# Patient Record
Sex: Male | Born: 1965 | Race: White | Hispanic: No
Health system: Midwestern US, Community
[De-identification: ages and names within clinical notes are randomized; demographics above are authoritative.]

## PROBLEM LIST (undated history)

## (undated) DIAGNOSIS — I10 Essential (primary) hypertension: Secondary | ICD-10-CM

## (undated) DIAGNOSIS — E7849 Other hyperlipidemia: Secondary | ICD-10-CM

## (undated) DIAGNOSIS — I2699 Other pulmonary embolism without acute cor pulmonale: Secondary | ICD-10-CM

## (undated) DIAGNOSIS — I639 Cerebral infarction, unspecified: Secondary | ICD-10-CM

## (undated) DIAGNOSIS — I509 Heart failure, unspecified: Secondary | ICD-10-CM

## (undated) DIAGNOSIS — IMO0001 Reserved for inherently not codable concepts without codable children: Secondary | ICD-10-CM

## (undated) DIAGNOSIS — I219 Acute myocardial infarction, unspecified: Secondary | ICD-10-CM

## (undated) DIAGNOSIS — Z720 Tobacco use: Secondary | ICD-10-CM

## (undated) DIAGNOSIS — I2510 Atherosclerotic heart disease of native coronary artery without angina pectoris: Secondary | ICD-10-CM

## (undated) HISTORY — PX: FINGER FRACTURE SURGERY: SHX638

## (undated) HISTORY — DX: Reserved for inherently not codable concepts without codable children: IMO0001

## (undated) HISTORY — PX: BACK SURGERY: SHX140

## (undated) HISTORY — PX: OTHER SURGICAL HISTORY: SHX169

## (undated) HISTORY — DX: Essential (primary) hypertension: I10

## (undated) HISTORY — DX: Acute myocardial infarction, unspecified: I21.9

## (undated) HISTORY — DX: Atherosclerotic heart disease of native coronary artery without angina pectoris: I25.10

## (undated) HISTORY — PX: CHOLECYSTECTOMY: SHX55

## (undated) HISTORY — DX: Other pulmonary embolism without acute cor pulmonale: I26.99

---

## 2010-03-19 DIAGNOSIS — I2699 Other pulmonary embolism without acute cor pulmonale: Secondary | ICD-10-CM

## 2010-03-19 HISTORY — DX: Other pulmonary embolism without acute cor pulmonale: I26.99

## 2012-07-18 HISTORY — PX: CARDIAC CATHETERIZATION: SHX172

## 2012-11-12 DIAGNOSIS — I2510 Atherosclerotic heart disease of native coronary artery without angina pectoris: Secondary | ICD-10-CM

## 2014-08-23 ENCOUNTER — Encounter: Payer: Self-pay | Admitting: Cardiovascular Disease

## 2014-08-23 ENCOUNTER — Ambulatory Visit (INDEPENDENT_AMBULATORY_CARE_PROVIDER_SITE_OTHER): Payer: BLUE CROSS/BLUE SHIELD | Admitting: Cardiovascular Disease

## 2014-08-23 VITALS — BP 132/86 | HR 64 | Ht 69.0 in | Wt 207.1 lb

## 2014-08-23 DIAGNOSIS — I739 Peripheral vascular disease, unspecified: Secondary | ICD-10-CM

## 2014-08-23 DIAGNOSIS — I2510 Atherosclerotic heart disease of native coronary artery without angina pectoris: Secondary | ICD-10-CM | POA: Insufficient documentation

## 2014-08-23 DIAGNOSIS — I10 Essential (primary) hypertension: Secondary | ICD-10-CM | POA: Insufficient documentation

## 2014-08-23 DIAGNOSIS — Z720 Tobacco use: Secondary | ICD-10-CM

## 2014-08-23 DIAGNOSIS — E785 Hyperlipidemia, unspecified: Secondary | ICD-10-CM | POA: Insufficient documentation

## 2014-08-23 DIAGNOSIS — R079 Chest pain, unspecified: Secondary | ICD-10-CM

## 2014-08-23 DIAGNOSIS — R0602 Shortness of breath: Secondary | ICD-10-CM

## 2014-08-23 MED ORDER — NITROGLYCERIN 0.4 MG SL SUBL: 0.4000 mg | SUBLINGUAL_TABLET | SUBLINGUAL | Status: DC | PRN
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
## 2014-08-23 NOTE — Patient Instructions (Addendum)
Labs - do not eat or drink the morning of the  Lab draw   Your physician has requested that you have en exercise stress myoview. For further information please visit [email or web address] Please follow instruction sheet, as given.    Schedule for ABI'S--Your physician has requested that you have a lower extremity arterial duplex. This test is an ultrasound of the arteries in the legs. It looks at arterial blood flow in the legs . Allow one hour for Lower scans. There are no restrictions or special instructions  PRESCRIPTION SENT TO PHARMACY -NTG  Your physician wants you to follow-up in 6 MONTHS DR [facility]  You will receive a reminder letter in the mail two months in advance. If you don't receive a letter, please call our office to schedule the follow-up appointment.

## 2014-09-11 ENCOUNTER — Telehealth (HOSPITAL_COMMUNITY): Payer: Self-pay

## 2014-09-11 NOTE — Telephone Encounter (Signed)
Encounter complete. 

## 2014-09-13 ENCOUNTER — Inpatient Hospital Stay (HOSPITAL_COMMUNITY): Admission: RE | Admit: 2014-09-13 | Payer: BLUE CROSS/BLUE SHIELD | Source: Ambulatory Visit

## 2014-09-20 ENCOUNTER — Ambulatory Visit (HOSPITAL_COMMUNITY)
Admission: RE | Admit: 2014-09-20 | Discharge: 2014-09-20 | Disposition: A | Discharge status: Home / Self Care | Payer: BLUE CROSS/BLUE SHIELD | Source: Ambulatory Visit | Attending: Cardiovascular Disease | Admitting: Cardiovascular Disease

## 2014-09-20 DIAGNOSIS — I2510 Atherosclerotic heart disease of native coronary artery without angina pectoris: Secondary | ICD-10-CM | POA: Insufficient documentation

## 2014-09-20 DIAGNOSIS — R079 Chest pain, unspecified: Secondary | ICD-10-CM | POA: Diagnosis not present

## 2014-09-20 DIAGNOSIS — F17200 Nicotine dependence, unspecified, uncomplicated: Secondary | ICD-10-CM | POA: Insufficient documentation

## 2014-09-20 DIAGNOSIS — E785 Hyperlipidemia, unspecified: Secondary | ICD-10-CM | POA: Insufficient documentation

## 2014-09-20 DIAGNOSIS — I10 Essential (primary) hypertension: Secondary | ICD-10-CM | POA: Insufficient documentation

## 2014-09-20 DIAGNOSIS — I739 Peripheral vascular disease, unspecified: Secondary | ICD-10-CM | POA: Insufficient documentation

## 2014-09-26 ENCOUNTER — Telehealth: Payer: Self-pay | Admitting: *Deleted

## 2014-09-26 NOTE — Telephone Encounter (Signed)
-----   Message from Chilton Si, MD sent at 09/23/2014  4:18 PM EDT ----- Normal ABIs.  The arterial blood flow in the legs is normal.

## 2014-09-26 NOTE — Telephone Encounter (Signed)
Spoke to patient. Result given . Verbalized understanding  

## 2014-10-02 ENCOUNTER — Telehealth (HOSPITAL_COMMUNITY): Payer: Self-pay

## 2014-10-02 NOTE — Telephone Encounter (Signed)
Encounter complete. 

## 2014-10-03 ENCOUNTER — Telehealth (HOSPITAL_COMMUNITY): Payer: Self-pay

## 2014-10-03 NOTE — Telephone Encounter (Signed)
Encounter complete. 

## 2014-10-04 ENCOUNTER — Ambulatory Visit (HOSPITAL_COMMUNITY)
Admission: RE | Admit: 2014-10-04 | Discharge: 2014-10-04 | Disposition: A | Discharge status: Home / Self Care | Payer: BLUE CROSS/BLUE SHIELD | Source: Ambulatory Visit | Attending: Cardiovascular Disease | Admitting: Cardiovascular Disease

## 2014-10-04 DIAGNOSIS — R079 Chest pain, unspecified: Secondary | ICD-10-CM | POA: Diagnosis not present

## 2014-10-04 DIAGNOSIS — I2510 Atherosclerotic heart disease of native coronary artery without angina pectoris: Secondary | ICD-10-CM | POA: Diagnosis not present

## 2014-10-04 DIAGNOSIS — E785 Hyperlipidemia, unspecified: Secondary | ICD-10-CM | POA: Diagnosis not present

## 2014-10-04 DIAGNOSIS — I739 Peripheral vascular disease, unspecified: Secondary | ICD-10-CM

## 2014-10-04 DIAGNOSIS — R9439 Abnormal result of other cardiovascular function study: Secondary | ICD-10-CM | POA: Insufficient documentation

## 2014-10-04 DIAGNOSIS — R42 Dizziness and giddiness: Secondary | ICD-10-CM | POA: Diagnosis not present

## 2014-10-04 DIAGNOSIS — F17200 Nicotine dependence, unspecified, uncomplicated: Secondary | ICD-10-CM | POA: Diagnosis not present

## 2014-10-04 DIAGNOSIS — I517 Cardiomegaly: Secondary | ICD-10-CM | POA: Diagnosis not present

## 2014-10-04 DIAGNOSIS — I10 Essential (primary) hypertension: Secondary | ICD-10-CM | POA: Diagnosis not present

## 2014-10-04 DIAGNOSIS — R0602 Shortness of breath: Secondary | ICD-10-CM | POA: Diagnosis not present

## 2014-10-04 DIAGNOSIS — R0609 Other forms of dyspnea: Secondary | ICD-10-CM | POA: Diagnosis not present

## 2014-10-04 MED ORDER — TECHNETIUM TC 99M SESTAMIBI GENERIC - CARDIOLITE
10.9000 | Freq: Once | INTRAVENOUS | Status: AC | PRN
  Administered 2014-10-04: 10.9 via INTRAVENOUS

## 2014-10-04 MED ORDER — TECHNETIUM TC 99M SESTAMIBI GENERIC - CARDIOLITE
31.3000 | Freq: Once | INTRAVENOUS | Status: AC | PRN
  Administered 2014-10-04: 31.3 via INTRAVENOUS

## 2014-10-05 LAB — MYOCARDIAL PERFUSION IMAGING
CHL CUP MPHR: 170 {beats}/min
CHL CUP NUCLEAR SRS: 13
CHL CUP RESTING HR STRESS: 71 {beats}/min
Estimated workload: 11.7 METS
Exercise duration (min): 10 min
LV sys vol: 87 mL
LVDIAVOL: 143 mL
Peak HR: 157 {beats}/min
Percent HR: 92 %
RPE: 16
SDS: 2
SSS: 15
TID: 0.98

## 2014-10-09 ENCOUNTER — Telehealth: Payer: Self-pay | Admitting: *Deleted

## 2014-10-09 NOTE — Telephone Encounter (Signed)
-----   Message from Chilton Si, MD sent at 10/07/2014 10:44 PM EDT ----- Stress test was abnormal.  Please schedule an appointment to discuss this stress test.

## 2014-10-09 NOTE — Telephone Encounter (Signed)
Spoke to patient. Result given . Verbalized understanding A PATIENT ONLY ABLE TO COME ON A Friday APPT -  SCHEDULE FOR 11/08/14 11AM

## 2014-10-11 NOTE — Telephone Encounter (Signed)
Informed patient as patient request, okay to  wait for appt  Per Dr Duke Salvia

## 2014-10-14 ENCOUNTER — Other Ambulatory Visit: Payer: Self-pay | Admitting: Cardiovascular Disease

## 2014-10-14 MED ORDER — ATORVASTATIN CALCIUM 80 MG PO TABS: 80.0000 mg | ORAL_TABLET | Freq: Every day | ORAL | Status: DC

## 2014-10-14 MED ORDER — CARVEDILOL 3.125 MG PO TABS: 3.1250 mg | ORAL_TABLET | Freq: Two times a day (BID) | ORAL | Status: DC

## 2014-10-14 MED ORDER — LISINOPRIL 5 MG PO TABS: 5.0000 mg | ORAL_TABLET | Freq: Every day | ORAL | Status: DC

## 2014-10-14 NOTE — Telephone Encounter (Signed)
°  1. Which medications need to be refilled? Lisinopril,Atorvastatin and Carvedilol-please call today  2. Which pharmacy is medication to be sent to?[phone]  3. Do they need a 30 day or 90 day supply? 90 and refills  4. Would they like a call back once the medication has been sent to the pharmacy? Yes-please leave message if no answer

## 2014-10-14 NOTE — Telephone Encounter (Signed)
Medications refilled electronically - carvedilol,atorvastatin, and lisinopril.  Patient notified.

## 2014-10-14 NOTE — Telephone Encounter (Signed)
°  1. Which medications need to be refilled? Carvedilol, Atorvastatin, Lisinopril  2. Which pharmacy is medication to be sent to?[facility] on [address]   3. Do they need a 30 day or 90 day supply? He would like a 90  4. Would they like a call back once the medication has been sent to the pharmacy? Yes

## 2014-10-14 NOTE — Telephone Encounter (Signed)
Medications refilled electronically - carvedilol,atorvastatin, and lisinopril.  Patient notified. 
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
## 2014-11-08 ENCOUNTER — Ambulatory Visit (INDEPENDENT_AMBULATORY_CARE_PROVIDER_SITE_OTHER): Payer: BLUE CROSS/BLUE SHIELD | Admitting: Cardiovascular Disease

## 2014-11-08 ENCOUNTER — Encounter: Payer: Self-pay | Admitting: Cardiology

## 2014-11-08 VITALS — BP 102/72 | HR 71 | Ht 69.0 in | Wt 212.9 lb

## 2014-11-08 DIAGNOSIS — I208 Other forms of angina pectoris: Secondary | ICD-10-CM

## 2014-11-08 DIAGNOSIS — I209 Angina pectoris, unspecified: Secondary | ICD-10-CM | POA: Diagnosis not present

## 2014-11-08 DIAGNOSIS — Z01810 Encounter for preprocedural cardiovascular examination: Secondary | ICD-10-CM

## 2014-11-08 DIAGNOSIS — R5383 Other fatigue: Secondary | ICD-10-CM

## 2014-11-08 DIAGNOSIS — R9439 Abnormal result of other cardiovascular function study: Secondary | ICD-10-CM

## 2014-11-08 DIAGNOSIS — D689 Coagulation defect, unspecified: Secondary | ICD-10-CM | POA: Diagnosis not present

## 2014-11-08 DIAGNOSIS — R5381 Other malaise: Secondary | ICD-10-CM

## 2014-11-08 DIAGNOSIS — I2089 Other forms of angina pectoris: Secondary | ICD-10-CM

## 2014-11-08 NOTE — Patient Instructions (Signed)
Dr [place] has requested that you have a cardiac catheterization. Cardiac catheterization is used to diagnose and/or treat various heart conditions. Doctors may recommend this procedure for a number of different reasons. The most common reason is to evaluate chest pain. Chest pain can be a symptom of coronary artery disease (CAD), and cardiac catheterization can show whether plaque is narrowing or blocking your heart's arteries. This procedure is also used to evaluate the valves, as well as measure the blood flow and oxygen levels in different parts of your heart. For further information please visit [email or web address]. Please follow instruction sheet, as given.  You will be required to have the following tests prior to the procedure:  1. Blood work-the blood work can be done no more than 7 days prior to the procedure.  It can be done at any [facility] lab.  There is one downstairs on the first floor of this building and one in the [facility] building ([address]).  2. Chest Xray-the chest xray order has already been placed at [facility] in the [facility].    Dr [place] recommends that you schedule a follow-up appointment after your procedure. 

## 2014-11-09 ENCOUNTER — Encounter: Payer: Self-pay | Admitting: Cardiovascular Disease

## 2014-11-12 ENCOUNTER — Telehealth: Payer: Self-pay | Admitting: Cardiovascular Disease

## 2014-11-12 NOTE — Telephone Encounter (Signed)
Faxed signed release to [facility] --Dr Cephas DarbyPeter Dilmartino - to obtain records per Dr Leonides Sakeandolph's request.  Faxed to [phone] on 11/12/14. lp

## 2014-11-13 ENCOUNTER — Telehealth: Payer: Self-pay | Admitting: Cardiovascular Disease

## 2014-11-13 DIAGNOSIS — I209 Angina pectoris, unspecified: Secondary | ICD-10-CM | POA: Diagnosis present

## 2014-11-13 DIAGNOSIS — R9439 Abnormal result of other cardiovascular function study: Secondary | ICD-10-CM | POA: Diagnosis present

## 2014-11-13 NOTE — Telephone Encounter (Signed)
Received records from [facility] per our request for records.  Records given to Dr Duke Salviaandolph for review. lp

## 2014-11-20 ENCOUNTER — Other Ambulatory Visit: Payer: Self-pay

## 2014-11-20 ENCOUNTER — Other Ambulatory Visit: Payer: Self-pay | Admitting: *Deleted

## 2014-11-20 DIAGNOSIS — R9439 Abnormal result of other cardiovascular function study: Secondary | ICD-10-CM

## 2014-11-20 DIAGNOSIS — I209 Angina pectoris, unspecified: Secondary | ICD-10-CM

## 2014-11-20 NOTE — Progress Notes (Signed)
Cath orders placed for procedure 11/22/2014 with Dr Bryan Lemmaavid Harding.

## 2014-11-21 ENCOUNTER — Ambulatory Visit
Admission: RE | Admit: 2014-11-21 | Discharge: 2014-11-21 | Disposition: A | Discharge status: Home / Self Care | Payer: BLUE CROSS/BLUE SHIELD | Source: Ambulatory Visit | Attending: Cardiovascular Disease | Admitting: Cardiovascular Disease

## 2014-11-21 DIAGNOSIS — I209 Angina pectoris, unspecified: Secondary | ICD-10-CM

## 2014-11-21 DIAGNOSIS — Z01810 Encounter for preprocedural cardiovascular examination: Secondary | ICD-10-CM

## 2014-11-21 DIAGNOSIS — R9439 Abnormal result of other cardiovascular function study: Secondary | ICD-10-CM

## 2014-11-21 LAB — CBC
HCT: 49 % (ref 39.0–52.0)
Hemoglobin: 16.4 g/dL (ref 13.0–17.0)
MCH: 29.9 pg (ref 26.0–34.0)
MCHC: 33.5 g/dL (ref 30.0–36.0)
MCV: 89.4 fL (ref 78.0–100.0)
MPV: 10.2 fL (ref 8.6–12.4)
PLATELETS: 211 10*3/uL (ref 150–400)
RBC: 5.48 MIL/uL (ref 4.22–5.81)
RDW: 12.5 % (ref 11.5–15.5)
WBC: 8.7 10*3/uL (ref 4.0–10.5)

## 2014-11-22 LAB — TSH: TSH: 0.45 u[IU]/mL (ref 0.350–4.500)

## 2014-11-22 LAB — BASIC METABOLIC PANEL
BUN: 14 mg/dL (ref 7–25)
CALCIUM: 8.9 mg/dL (ref 8.6–10.3)
CO2: 28 mmol/L (ref 20–31)
Chloride: 105 mmol/L (ref 98–110)
Creat: 0.85 mg/dL (ref 0.70–1.33)
Glucose, Bld: 81 mg/dL (ref 65–99)
POTASSIUM: 4.5 mmol/L (ref 3.5–5.3)
SODIUM: 140 mmol/L (ref 135–146)

## 2014-11-22 LAB — PROTIME-INR
INR: 0.94 (ref <1.50)
Prothrombin Time: 12.7 seconds (ref 11.6–15.2)

## 2014-11-22 LAB — APTT: APTT: 29 s (ref 24–37)

## 2014-11-25 ENCOUNTER — Telehealth: Payer: Self-pay | Admitting: *Deleted

## 2014-11-25 NOTE — Telephone Encounter (Signed)
Left message all test are good for procedure

## 2014-11-28 ENCOUNTER — Ambulatory Visit (HOSPITAL_COMMUNITY)
Admission: RE | Admit: 2014-11-28 | Discharge: 2014-11-28 | Disposition: A | Discharge status: Home / Self Care | Payer: BLUE CROSS/BLUE SHIELD | Source: Ambulatory Visit | Attending: Cardiology | Admitting: Cardiology

## 2014-11-28 ENCOUNTER — Encounter (HOSPITAL_COMMUNITY)
Admission: RE | Disposition: A | Discharge status: Home / Self Care | Payer: Self-pay | Source: Ambulatory Visit | Attending: Cardiology

## 2014-11-28 DIAGNOSIS — I10 Essential (primary) hypertension: Secondary | ICD-10-CM | POA: Diagnosis not present

## 2014-11-28 DIAGNOSIS — E785 Hyperlipidemia, unspecified: Secondary | ICD-10-CM | POA: Diagnosis present

## 2014-11-28 DIAGNOSIS — Z8249 Family history of ischemic heart disease and other diseases of the circulatory system: Secondary | ICD-10-CM | POA: Diagnosis not present

## 2014-11-28 DIAGNOSIS — R9439 Abnormal result of other cardiovascular function study: Secondary | ICD-10-CM | POA: Diagnosis present

## 2014-11-28 DIAGNOSIS — Z7982 Long term (current) use of aspirin: Secondary | ICD-10-CM | POA: Diagnosis not present

## 2014-11-28 DIAGNOSIS — I252 Old myocardial infarction: Secondary | ICD-10-CM | POA: Insufficient documentation

## 2014-11-28 DIAGNOSIS — R0602 Shortness of breath: Secondary | ICD-10-CM | POA: Diagnosis present

## 2014-11-28 DIAGNOSIS — Z6830 Body mass index (BMI) 30.0-30.9, adult: Secondary | ICD-10-CM | POA: Insufficient documentation

## 2014-11-28 DIAGNOSIS — Z9861 Coronary angioplasty status: Secondary | ICD-10-CM

## 2014-11-28 DIAGNOSIS — Z86711 Personal history of pulmonary embolism: Secondary | ICD-10-CM | POA: Diagnosis not present

## 2014-11-28 DIAGNOSIS — E669 Obesity, unspecified: Secondary | ICD-10-CM | POA: Diagnosis not present

## 2014-11-28 DIAGNOSIS — I2510 Atherosclerotic heart disease of native coronary artery without angina pectoris: Secondary | ICD-10-CM | POA: Diagnosis not present

## 2014-11-28 DIAGNOSIS — Z955 Presence of coronary angioplasty implant and graft: Secondary | ICD-10-CM | POA: Insufficient documentation

## 2014-11-28 DIAGNOSIS — R079 Chest pain, unspecified: Secondary | ICD-10-CM | POA: Diagnosis not present

## 2014-11-28 DIAGNOSIS — F17210 Nicotine dependence, cigarettes, uncomplicated: Secondary | ICD-10-CM | POA: Diagnosis not present

## 2014-11-28 DIAGNOSIS — I209 Angina pectoris, unspecified: Secondary | ICD-10-CM | POA: Diagnosis present

## 2014-11-28 DIAGNOSIS — Z720 Tobacco use: Secondary | ICD-10-CM | POA: Diagnosis present

## 2014-11-28 HISTORY — PX: CARDIAC CATHETERIZATION: SHX172

## 2014-11-28 SURGERY — LEFT HEART CATH AND CORONARY ANGIOGRAPHY
Anesthesia: LOCAL

## 2014-11-28 MED ORDER — MIDAZOLAM HCL 2 MG/2ML IJ SOLN
INTRAMUSCULAR | Status: DC | PRN
  Administered 2014-11-28: 2 mg via INTRAVENOUS
  Administered 2014-11-28: 1 mg via INTRAVENOUS

## 2014-11-28 MED ORDER — MIDAZOLAM HCL 2 MG/2ML IJ SOLN
INTRAMUSCULAR | Status: AC
  Filled 2014-11-28: qty 4

## 2014-11-28 MED ORDER — SODIUM CHLORIDE 0.9 % IJ SOLN: 3.0000 mL | Freq: Two times a day (BID) | INTRAMUSCULAR | Status: DC

## 2014-11-28 MED ORDER — VERAPAMIL HCL 2.5 MG/ML IV SOLN
INTRAVENOUS | Status: AC
  Filled 2014-11-28: qty 2

## 2014-11-28 MED ORDER — LIDOCAINE HCL (PF) 1 % IJ SOLN
INTRAMUSCULAR | Status: AC
  Filled 2014-11-28: qty 30

## 2014-11-28 MED ORDER — SODIUM CHLORIDE 0.9 % IJ SOLN: 3.0000 mL | INTRAMUSCULAR | Status: DC | PRN

## 2014-11-28 MED ORDER — FENTANYL CITRATE (PF) 100 MCG/2ML IJ SOLN
INTRAMUSCULAR | Status: DC | PRN
  Administered 2014-11-28: 50 ug via INTRAVENOUS
  Administered 2014-11-28: 25 ug via INTRAVENOUS

## 2014-11-28 MED ORDER — SODIUM CHLORIDE 0.9 % WEIGHT BASED INFUSION: 1.0000 mL/kg/h | INTRAVENOUS | Status: AC

## 2014-11-28 MED ORDER — HEPARIN SODIUM (PORCINE) 1000 UNIT/ML IJ SOLN
INTRAMUSCULAR | Status: DC | PRN
Start: 2014-11-28 — End: 2014-11-28
  Administered 2014-11-28: 5000 [IU] via INTRAVENOUS

## 2014-11-28 MED ORDER — ASPIRIN 81 MG PO CHEW: 81.0000 mg | CHEWABLE_TABLET | ORAL | Status: DC

## 2014-11-28 MED ORDER — HEPARIN (PORCINE) IN NACL 2-0.9 UNIT/ML-% IJ SOLN
INTRAMUSCULAR | Status: AC
  Filled 2014-11-28: qty 1000

## 2014-11-28 MED ORDER — SODIUM CHLORIDE 0.9 % WEIGHT BASED INFUSION
3.0000 mL/kg/h | INTRAVENOUS | Status: DC
  Administered 2014-11-28: 3 mL/kg/h via INTRAVENOUS

## 2014-11-28 MED ORDER — FENTANYL CITRATE (PF) 100 MCG/2ML IJ SOLN
INTRAMUSCULAR | Status: AC
  Filled 2014-11-28: qty 4

## 2014-11-28 MED ORDER — LIDOCAINE HCL (PF) 1 % IJ SOLN
INTRAMUSCULAR | Status: DC | PRN
  Administered 2014-11-28: 11:00:00

## 2014-11-28 MED ORDER — SODIUM CHLORIDE 0.9 % WEIGHT BASED INFUSION: 1.0000 mL/kg/h | INTRAVENOUS | Status: DC

## 2014-11-28 MED ORDER — SODIUM CHLORIDE 0.9 % IV SOLN: 250.0000 mL | INTRAVENOUS | Status: DC | PRN

## 2014-11-28 MED ORDER — VERAPAMIL HCL 2.5 MG/ML IV SOLN
INTRAVENOUS | Status: DC | PRN
  Administered 2014-11-28: 2 mL via INTRA_ARTERIAL
  Administered 2014-11-28: 11:00:00 via INTRA_ARTERIAL

## 2014-11-28 MED ORDER — HEPARIN SODIUM (PORCINE) 1000 UNIT/ML IJ SOLN
INTRAMUSCULAR | Status: AC
  Filled 2014-11-28: qty 1

## 2014-11-28 MED ORDER — IOHEXOL 350 MG/ML SOLN
INTRAVENOUS | Status: DC | PRN
  Administered 2014-11-28: 75 mL via INTRA_ARTERIAL

## 2014-11-28 SURGICAL SUPPLY — 11 items

## 2014-11-28 NOTE — Discharge Instructions (Signed)
Radial Site Care °Refer to this sheet in the next few weeks. These instructions provide you with information about caring for yourself after your procedure. Your health care provider may also give you more specific instructions. Your treatment has been planned according to current medical practices, but problems sometimes occur. Call your health care provider if you have any problems or questions after your procedure. °WHAT TO EXPECT AFTER THE PROCEDURE °After your procedure, it is typical to have the following: °· Bruising at the radial site that usually fades within 1-2 weeks. °· Blood collecting in the tissue (hematoma) that may be painful to the touch. It should usually decrease in size and tenderness within 1-2 weeks. °HOME CARE INSTRUCTIONS °· Take medicines only as directed by your health care provider. °· You may shower 24-48 hours after the procedure or as directed by your health care provider. Remove the bandage (dressing) and gently wash the site with plain soap and water. Pat the area dry with a clean towel. Do not rub the site, because this may cause bleeding. °· Do not take baths, swim, or use a hot tub until your health care provider approves. °· Check your insertion site every day for redness, swelling, or drainage. °· Do not apply powder or lotion to the site. °· Do not flex or bend the affected arm for 24 hours or as directed by your health care provider. °· Do not push or pull heavy objects with the affected arm for 24 hours or as directed by your health care provider. °· Do not lift over 10 lb (4.5 kg) for 5 days after your procedure or as directed by your health care provider. °· Ask your health care provider when it is okay to: °¨ Return to work or school. °¨ Resume usual physical activities or sports. °¨ Resume sexual activity. °· Do not drive home if you are discharged the same day as the procedure. Have someone else drive you. °· You may drive 24 hours after the procedure unless otherwise  instructed by your health care provider. °· Do not operate machinery or power tools for 24 hours after the procedure. °· If your procedure was done as an outpatient procedure, which means that you went home the same day as your procedure, a responsible adult should be with you for the first 24 hours after you arrive home. °· Keep all follow-up visits as directed by your health care provider. This is important. °SEEK MEDICAL CARE IF: °· You have a fever. °· You have chills. °· You have increased bleeding from the radial site. Hold pressure on the site. °SEEK IMMEDIATE MEDICAL CARE IF: °· You have unusual pain at the radial site. °· You have redness, warmth, or swelling at the radial site. °· You have drainage (other than a small amount of blood on the dressing) from the radial site. °· The radial site is bleeding, and the bleeding does not stop after 30 minutes of holding steady pressure on the site. °· Your arm or hand becomes pale, cool, tingly, or numb. °  °This information is not intended to replace advice given to you by your health care provider. Make sure you discuss any questions you have with your health care provider. °  °Document Released: 02/06/2010 Document Revised: 01/25/2014 Document Reviewed: 07/23/2013 °Elsevier Interactive Patient Education ©2016 [employer] ° °

## 2014-11-28 NOTE — Interval H&P Note (Signed)
Cath Lab Visit (complete for each Cath Lab visit)  Clinical Evaluation Leading to the Procedure:   ACS: No.  Non-ACS:    Anginal Classification: CCS III  Anti-ischemic medical therapy: Minimal Therapy (1 class of medications)  Non-Invasive Test Results: High-risk stress test findings: cardiac mortality >3%/year  Prior CABG: No previous CABG      History and Physical Interval Note:  11/28/2014 10:28 AM  Carl Sanchez  has presented today for surgery, with the diagnosis of Abnormal Stress Test  The various methods of treatment have been discussed with the patient and family. After consideration of risks, benefits and other options for treatment, the patient has consented to  Procedure(s): Left Heart Cath and Coronary Angiography (N/A) as a surgical intervention .  The patient's history has been reviewed, patient examined, no change in status, stable for surgery.  I have reviewed the patient's chart and labs.  Questions were answered to the patient's satisfaction.     Evin Chirco S.

## 2014-11-28 NOTE — Progress Notes (Signed)
Carl Sanchez from cath lab came and checked client's right wrist and per Carl Sanchez site does not look swollen and call Dr Eldridge DaceVaranasi and notified him of above and right radial pulse 1+ and no new orders noted and ok to d/c home
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
## 2014-11-29 ENCOUNTER — Encounter (HOSPITAL_COMMUNITY): Payer: Self-pay | Admitting: Interventional Cardiology

## 2014-12-02 ENCOUNTER — Telehealth: Payer: Self-pay | Admitting: *Deleted

## 2014-12-02 MED ORDER — RANOLAZINE ER 500 MG PO TB12: 500.0000 mg | ORAL_TABLET | Freq: Two times a day (BID) | ORAL | Status: DC

## 2014-12-02 NOTE — Telephone Encounter (Signed)
Spoke to patient. Information given to start Ranexa 500 mg  Twice a day Patient states he would like to research the medications first -- but will pick samples that are available. (before prescription is given) Appointment schedule for Dec 19, 2014 at 11 am Patient verbalized understanding.

## 2014-12-02 NOTE — Telephone Encounter (Signed)
-----   Message from Chilton Siiffany , MD sent at 12/02/2014  6:03 AM EST ----- Please start [name] on Ranexa 500 mg bid and schedule for follow up in 2 weeks.

## 2014-12-09 ENCOUNTER — Other Ambulatory Visit: Payer: Self-pay | Admitting: Cardiovascular Disease

## 2014-12-09 NOTE — Telephone Encounter (Signed)
°*  STAT* If patient is at the pharmacy, call can be transferred to refill team.   1. Which medications need to be refilled? (please list name of each medication and dose if known) Ranexa   2. Which pharmacy/location (including street and city if local pharmacy) is medication to be sent to?[facility]  3. Do they need a 30 day or 90 day supply? 90 and refills

## 2014-12-16 ENCOUNTER — Telehealth: Payer: Self-pay | Admitting: Cardiovascular Disease

## 2014-12-16 MED ORDER — RANOLAZINE ER 500 MG PO TB12: 500.0000 mg | ORAL_TABLET | Freq: Two times a day (BID) | ORAL | Status: DC

## 2014-12-16 MED ORDER — NITROGLYCERIN 0.4 MG SL SUBL: 0.4000 mg | SUBLINGUAL_TABLET | SUBLINGUAL | Status: DC | PRN

## 2014-12-16 NOTE — Telephone Encounter (Signed)
Refills completed, advised to get new Nitro bottle, pt aware.

## 2014-12-16 NOTE — Telephone Encounter (Signed)
Returned call to patient Ranexa 500 mg samples left at [facility] office front desk. 

## 2014-12-16 NOTE — Telephone Encounter (Signed)
Carl Sanchez is calling because the Ranexa is really expensive even with insurance , its $200 for Three months and is wanting some samples .Marland Kitchen. Please call she wants to pick it up today ..Marland Kitchen

## 2014-12-16 NOTE — Telephone Encounter (Signed)
°*  STAT* If patient is at the pharmacy, call can be transferred to refill team.   1. Which medications need to be refilled? (please list name of each medication and dose if known) Ranexa   2. Which pharmacy/location (including street and city if local pharmacy) is medication to be sent to?[facility] [address]   3. Do they need a 30 day or 90 day supply? 30   *Pt's girlfriend also called in stating that the pt's Nitro pills were left in the car and the temperatures were around freezing , she wanted to know if the pills were still good and if not can a new prescription be called in for him*
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
## 2014-12-19 ENCOUNTER — Ambulatory Visit (INDEPENDENT_AMBULATORY_CARE_PROVIDER_SITE_OTHER): Payer: BLUE CROSS/BLUE SHIELD | Admitting: Cardiovascular Disease

## 2014-12-19 ENCOUNTER — Encounter: Payer: Self-pay | Admitting: Cardiovascular Disease

## 2014-12-19 VITALS — BP 109/73 | HR 73 | Ht 69.0 in | Wt 208.5 lb

## 2014-12-19 DIAGNOSIS — I10 Essential (primary) hypertension: Secondary | ICD-10-CM

## 2014-12-19 DIAGNOSIS — Z716 Tobacco abuse counseling: Secondary | ICD-10-CM | POA: Diagnosis not present

## 2014-12-19 DIAGNOSIS — I2583 Coronary atherosclerosis due to lipid rich plaque: Principal | ICD-10-CM

## 2014-12-19 DIAGNOSIS — I2510 Atherosclerotic heart disease of native coronary artery without angina pectoris: Secondary | ICD-10-CM | POA: Diagnosis not present

## 2014-12-19 DIAGNOSIS — E785 Hyperlipidemia, unspecified: Secondary | ICD-10-CM | POA: Diagnosis not present

## 2014-12-19 MED ORDER — CARVEDILOL 3.125 MG PO TABS: 3.1250 mg | ORAL_TABLET | Freq: Two times a day (BID) | ORAL | Status: DC

## 2014-12-19 MED ORDER — ATORVASTATIN CALCIUM 80 MG PO TABS: 80.0000 mg | ORAL_TABLET | Freq: Every day | ORAL | Status: DC

## 2014-12-19 MED ORDER — LISINOPRIL 5 MG PO TABS: 5.0000 mg | ORAL_TABLET | Freq: Every day | ORAL | Status: DC

## 2014-12-19 MED ORDER — BUPROPION HCL ER (SR) 150 MG PO TB12: ORAL_TABLET | ORAL | Status: DC

## 2014-12-19 MED ORDER — RANOLAZINE ER 500 MG PO TB12: 500.0000 mg | ORAL_TABLET | Freq: Two times a day (BID) | ORAL | Status: DC

## 2014-12-19 NOTE — Patient Instructions (Signed)
Medication Instructions:  START WELLBUTRIN 150 MG ONCE DAILY FOR 3 DAYS AND THEN TWICE A DAY FOR 12 WEEKS  Labwork: NONE  Testing/Procedures: NONE  Follow-Up: Your physician wants you to follow-up in: 6 MONTH OV You will receive a reminder letter in the mail two months in advance. If you don't receive a letter, please call our office to schedule the follow-up appointment.  If you need a refill on your cardiac medications before your next appointment, please call your pharmacy.

## 2014-12-24 ENCOUNTER — Telehealth: Payer: Self-pay | Admitting: Cardiovascular Disease

## 2014-12-24 NOTE — Telephone Encounter (Signed)
Pt still having dizziness on Ranexa. [name] also cites high cost of med as prohibitive. Wants to know if OK for pt to discontinue.  I inquired about recent BPs - none to report, she states "well how am I going to check it? I don't have a machine at home." Offered BP visit, Rx for cuff - this was unacknowledged. Caller dismissive as I tried to discuss options, inquire about other symptoms - somewhat confrontational. I explained I was just trying to brainstorm possible options and would defer to physician for advice.  She states pt will be home tomorrow. Asked if we can we make recommendation on the medication. Informed I would defer to [facility] for advice.

## 2014-12-24 NOTE — Telephone Encounter (Signed)
Sounds like a good plan.  Will defer to Dr. Duke Salviaandolph.  Marykay LexHARDING, Ron Junco W, MD

## 2014-12-24 NOTE — Telephone Encounter (Signed)
[name] states that Ranexa is still making [name] dizzy.  Can he stop taking it?

## 2014-12-24 NOTE — Telephone Encounter (Signed)
He can stop, but be aware that chest discomfort may come back.  He could be switched to isosorbide mono 30 mg and adjusted accordingly

## 2014-12-26 NOTE — Telephone Encounter (Signed)
Left message for return call to discuss recommendations from [place].

## 2015-01-14 ENCOUNTER — Telehealth: Payer: Self-pay | Admitting: Cardiovascular Disease

## 2015-01-14 MED ORDER — ISOSORBIDE MONONITRATE ER 30 MG PO TB24: 30.0000 mg | ORAL_TABLET | Freq: Every day | ORAL | Status: DC

## 2015-01-14 NOTE — Telephone Encounter (Signed)
Renexa discontinued as to price and c/o dizziness Isosorbide mon 30 mg [phone] refills

## 2015-01-14 NOTE — Telephone Encounter (Signed)
Have him try isosorbide mono 30 mg qd.  Would suggest starting with 30 day supply to be sure he tolerates it and we can determine if the dose is best.

## 2015-01-14 NOTE — Telephone Encounter (Signed)
Please call,concerning his Ranexa.

## 2015-01-14 NOTE — Telephone Encounter (Signed)
Patient called this morning concerning his Ranexa It is causing dizziness Can not afford the $100  Reviewing past notes Dr. Antoine PocheHochrein has approved of switching him from Renexa to Isosorbide mono 30 mg Patient and finance [name] agree  Routing to [place] for dose and frequency Also routing to Dr. Duke Salviaandolph  When instructions received for dosage and frequency of Isosorbide mono I will discontinue Renexa and [facility] [address] for a 90 day supply and  Call patient back [phone])

## 2015-01-17 NOTE — Telephone Encounter (Signed)
OK thank you 

## 2015-01-27 ENCOUNTER — Telehealth: Payer: Self-pay | Admitting: Cardiovascular Disease

## 2015-01-27 NOTE — Telephone Encounter (Signed)
Carl Sanchez's prior records from the [facility] were reviewed.  Echo 11/02/14: LVEF 40%.  Normal diastolic function.  Mild left ventricular hypertrophy.Normal RV function.   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  He underwent PCI of the LAD with a DES in October, 2015.  There is no mention of cardiac arrest in his clinic notes, though a discharge summary is not available.

## 2015-02-20 ENCOUNTER — Other Ambulatory Visit: Payer: Self-pay | Admitting: *Deleted

## 2015-02-20 MED ORDER — LISINOPRIL 5 MG PO TABS: 5.0000 mg | ORAL_TABLET | Freq: Every day | ORAL | Status: DC

## 2015-02-20 MED ORDER — ISOSORBIDE MONONITRATE ER 30 MG PO TB24: 30.0000 mg | ORAL_TABLET | Freq: Every day | ORAL | Status: DC

## 2015-02-20 MED ORDER — CARVEDILOL 3.125 MG PO TABS: 3.1250 mg | ORAL_TABLET | Freq: Two times a day (BID) | ORAL | Status: DC

## 2015-02-20 MED ORDER — ATORVASTATIN CALCIUM 80 MG PO TABS: 80.0000 mg | ORAL_TABLET | Freq: Every day | ORAL | Status: DC

## 2015-02-26 ENCOUNTER — Other Ambulatory Visit: Payer: Self-pay | Admitting: *Deleted

## 2015-02-26 MED ORDER — BUPROPION HCL ER (SR) 150 MG PO TB12: ORAL_TABLET | ORAL | Status: DC

## 2015-04-11 ENCOUNTER — Emergency Department (HOSPITAL_COMMUNITY): Payer: BLUE CROSS/BLUE SHIELD

## 2015-04-11 ENCOUNTER — Inpatient Hospital Stay (HOSPITAL_COMMUNITY)
Admission: EM | Admit: 2015-04-11 | Discharge: 2015-04-14 | DRG: 066 | Disposition: A | Discharge status: Home Health | Payer: BLUE CROSS/BLUE SHIELD | Attending: Family Medicine | Admitting: Family Medicine

## 2015-04-11 ENCOUNTER — Encounter (HOSPITAL_COMMUNITY): Payer: Self-pay | Admitting: Neurology

## 2015-04-11 DIAGNOSIS — I639 Cerebral infarction, unspecified: Principal | ICD-10-CM | POA: Diagnosis present

## 2015-04-11 DIAGNOSIS — Z720 Tobacco use: Secondary | ICD-10-CM | POA: Diagnosis present

## 2015-04-11 DIAGNOSIS — G43909 Migraine, unspecified, not intractable, without status migrainosus: Secondary | ICD-10-CM | POA: Diagnosis not present

## 2015-04-11 DIAGNOSIS — R200 Anesthesia of skin: Secondary | ICD-10-CM | POA: Diagnosis present

## 2015-04-11 DIAGNOSIS — Z955 Presence of coronary angioplasty implant and graft: Secondary | ICD-10-CM

## 2015-04-11 DIAGNOSIS — I10 Essential (primary) hypertension: Secondary | ICD-10-CM | POA: Diagnosis not present

## 2015-04-11 DIAGNOSIS — Z6828 Body mass index (BMI) 28.0-28.9, adult: Secondary | ICD-10-CM

## 2015-04-11 DIAGNOSIS — F17210 Nicotine dependence, cigarettes, uncomplicated: Secondary | ICD-10-CM | POA: Diagnosis present

## 2015-04-11 DIAGNOSIS — R402362 Coma scale, best motor response, obeys commands, at arrival to emergency department: Secondary | ICD-10-CM | POA: Diagnosis present

## 2015-04-11 DIAGNOSIS — R519 Headache, unspecified: Secondary | ICD-10-CM | POA: Diagnosis present

## 2015-04-11 DIAGNOSIS — R42 Dizziness and giddiness: Secondary | ICD-10-CM | POA: Diagnosis not present

## 2015-04-11 DIAGNOSIS — R51 Headache: Secondary | ICD-10-CM

## 2015-04-11 DIAGNOSIS — E669 Obesity, unspecified: Secondary | ICD-10-CM | POA: Diagnosis present

## 2015-04-11 DIAGNOSIS — R29702 NIHSS score 2: Secondary | ICD-10-CM | POA: Diagnosis present

## 2015-04-11 DIAGNOSIS — Z8249 Family history of ischemic heart disease and other diseases of the circulatory system: Secondary | ICD-10-CM

## 2015-04-11 DIAGNOSIS — Z87828 Personal history of other (healed) physical injury and trauma: Secondary | ICD-10-CM

## 2015-04-11 DIAGNOSIS — H5500 Unspecified nystagmus: Secondary | ICD-10-CM | POA: Diagnosis present

## 2015-04-11 DIAGNOSIS — I6789 Other cerebrovascular disease: Secondary | ICD-10-CM | POA: Diagnosis present

## 2015-04-11 DIAGNOSIS — I252 Old myocardial infarction: Secondary | ICD-10-CM

## 2015-04-11 DIAGNOSIS — R402252 Coma scale, best verbal response, oriented, at arrival to emergency department: Secondary | ICD-10-CM | POA: Diagnosis present

## 2015-04-11 DIAGNOSIS — E785 Hyperlipidemia, unspecified: Secondary | ICD-10-CM | POA: Diagnosis present

## 2015-04-11 DIAGNOSIS — Z9861 Coronary angioplasty status: Secondary | ICD-10-CM

## 2015-04-11 DIAGNOSIS — R402142 Coma scale, eyes open, spontaneous, at arrival to emergency department: Secondary | ICD-10-CM | POA: Diagnosis present

## 2015-04-11 DIAGNOSIS — E1165 Type 2 diabetes mellitus with hyperglycemia: Secondary | ICD-10-CM | POA: Diagnosis present

## 2015-04-11 DIAGNOSIS — Z7982 Long term (current) use of aspirin: Secondary | ICD-10-CM

## 2015-04-11 DIAGNOSIS — G43109 Migraine with aura, not intractable, without status migrainosus: Secondary | ICD-10-CM

## 2015-04-11 DIAGNOSIS — Z86711 Personal history of pulmonary embolism: Secondary | ICD-10-CM

## 2015-04-11 DIAGNOSIS — I2510 Atherosclerotic heart disease of native coronary artery without angina pectoris: Secondary | ICD-10-CM

## 2015-04-11 LAB — DIFFERENTIAL
BASOS PCT: 0 %
Basophils Absolute: 0.1 10*3/uL (ref 0.0–0.1)
EOS PCT: 2 %
Eosinophils Absolute: 0.2 10*3/uL (ref 0.0–0.7)
Lymphocytes Relative: 39 %
Lymphs Abs: 4.5 10*3/uL — ABNORMAL HIGH (ref 0.7–4.0)
MONO ABS: 1 10*3/uL (ref 0.1–1.0)
Monocytes Relative: 8 %
Neutro Abs: 5.8 10*3/uL (ref 1.7–7.7)
Neutrophils Relative %: 51 %

## 2015-04-11 LAB — CBC
HCT: 50.5 % (ref 39.0–52.0)
Hemoglobin: 17.5 g/dL — ABNORMAL HIGH (ref 13.0–17.0)
MCH: 29.7 pg (ref 26.0–34.0)
MCHC: 34.7 g/dL (ref 30.0–36.0)
MCV: 85.6 fL (ref 78.0–100.0)
PLATELETS: 223 10*3/uL (ref 150–400)
RBC: 5.9 MIL/uL — ABNORMAL HIGH (ref 4.22–5.81)
RDW: 12 % (ref 11.5–15.5)
WBC: 11.5 10*3/uL — ABNORMAL HIGH (ref 4.0–10.5)

## 2015-04-11 LAB — COMPREHENSIVE METABOLIC PANEL
ALBUMIN: 4 g/dL (ref 3.5–5.0)
ALT: 36 U/L (ref 17–63)
ANION GAP: 13 (ref 5–15)
AST: 31 U/L (ref 15–41)
Alkaline Phosphatase: 93 U/L (ref 38–126)
BILIRUBIN TOTAL: 1 mg/dL (ref 0.3–1.2)
BUN: 9 mg/dL (ref 6–20)
CHLORIDE: 107 mmol/L (ref 101–111)
CO2: 20 mmol/L — ABNORMAL LOW (ref 22–32)
Calcium: 9.4 mg/dL (ref 8.9–10.3)
Creatinine, Ser: 1.22 mg/dL (ref 0.61–1.24)
GFR calc Af Amer: >60 mL/min (ref >60)
GFR calc non Af Amer: >60 mL/min (ref >60)
GLUCOSE: 143 mg/dL — AB (ref 65–99)
POTASSIUM: 3.5 mmol/L (ref 3.5–5.1)
Sodium: 140 mmol/L (ref 135–145)
TOTAL PROTEIN: 7.2 g/dL (ref 6.5–8.1)

## 2015-04-11 LAB — I-STAT CHEM 8, ED
BUN: 10 mg/dL (ref 6–20)
CALCIUM ION: 1.06 mmol/L — AB (ref 1.12–1.23)
CHLORIDE: 105 mmol/L (ref 101–111)
Creatinine, Ser: 1 mg/dL (ref 0.61–1.24)
Glucose, Bld: 139 mg/dL — ABNORMAL HIGH (ref 65–99)
HEMATOCRIT: 55 % — AB (ref 39.0–52.0)
Hemoglobin: 18.7 g/dL — ABNORMAL HIGH (ref 13.0–17.0)
Potassium: 3.4 mmol/L — ABNORMAL LOW (ref 3.5–5.1)
SODIUM: 143 mmol/L (ref 135–145)
TCO2: 22 mmol/L (ref 0–100)

## 2015-04-11 LAB — CBG MONITORING, ED: GLUCOSE-CAPILLARY: 125 mg/dL — AB (ref 65–99)

## 2015-04-11 LAB — PROTIME-INR
INR: 1 (ref 0.00–1.49)
PROTHROMBIN TIME: 13.4 s (ref 11.6–15.2)

## 2015-04-11 LAB — I-STAT TROPONIN, ED: Troponin i, poc: 0 ng/mL (ref 0.00–0.08)

## 2015-04-11 LAB — APTT: aPTT: 24 seconds (ref 24–37)

## 2015-04-11 MED ORDER — DIPHENHYDRAMINE HCL 50 MG/ML IJ SOLN
12.5000 mg | Freq: Once | INTRAMUSCULAR | Status: AC
  Administered 2015-04-12: 12.5 mg via INTRAVENOUS
  Filled 2015-04-11: qty 1

## 2015-04-11 MED ORDER — PROCHLORPERAZINE EDISYLATE 5 MG/ML IJ SOLN
10.0000 mg | Freq: Once | INTRAMUSCULAR | Status: AC
  Administered 2015-04-12: 10 mg via INTRAVENOUS
  Filled 2015-04-11: qty 2

## 2015-04-11 MED ORDER — KETOROLAC TROMETHAMINE 15 MG/ML IJ SOLN
15.0000 mg | Freq: Once | INTRAMUSCULAR | Status: AC
  Administered 2015-04-12: 15 mg via INTRAVENOUS
  Filled 2015-04-11: qty 1

## 2015-04-11 MED ORDER — ONDANSETRON HCL 4 MG/2ML IJ SOLN
4.0000 mg | Freq: Once | INTRAMUSCULAR | Status: AC
  Administered 2015-04-11: 4 mg via INTRAVENOUS
  Filled 2015-04-11: qty 2

## 2015-04-11 MED ORDER — IOHEXOL 350 MG/ML SOLN
50.0000 mL | Freq: Once | INTRAVENOUS | Status: AC | PRN
  Administered 2015-04-11: 50 mL via INTRAVENOUS
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
## 2015-04-11 NOTE — ED Notes (Signed)
Family at bedside. 

## 2015-04-11 NOTE — ED Notes (Signed)
Patient transported back to CT at this time for CTA. Neurologist at bedside.

## 2015-04-11 NOTE — Code Documentation (Addendum)
[name] for acute onset Rt side HA, dizziness, and near syncope while getting ready for work.  Symptoms began at 2100.  [facility] he was profusely diaphoretic, pale, and actively vomiting.  Per EMS SBP 130s, HR 60s, sats 97% RA, BS 102.  NIH 2 for drowsiness and Lt facial numbness, + nystagmus. Pt taken to CT for head without and then CTA head and neck.  Zofran given in CT scan for nausea/vomiting. HX MI with stent & HTN per pt.  States he is taking an ASA but is unsure of his other meds.

## 2015-04-11 NOTE — ED Notes (Signed)
Patient passed stroke swallow screen. Provided him with ice chips upon request.

## 2015-04-11 NOTE — ED Notes (Signed)
Dr Beaton at bedside 

## 2015-04-11 NOTE — ED Notes (Signed)
PER [facility]: Patient to ED from home with stroke symptoms starting at 2100 this evening. Patient got out of shower and immediately started experiencing dizziness, N/V x 2, diaphoresis, and R sided headache. Nystagmus present upon arrival, no facial droop or aphasia noted. Pt hx MI, HTN. EMS VS: HR 60 NSR, 138/80, 97% RA, CBG 102, 18g. L hand. . Patient appears lethargic and very weak at this time.
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
## 2015-04-12 ENCOUNTER — Observation Stay (HOSPITAL_COMMUNITY): Payer: BLUE CROSS/BLUE SHIELD

## 2015-04-12 ENCOUNTER — Encounter (HOSPITAL_COMMUNITY): Payer: Self-pay | Admitting: Internal Medicine

## 2015-04-12 DIAGNOSIS — F17210 Nicotine dependence, cigarettes, uncomplicated: Secondary | ICD-10-CM | POA: Diagnosis present

## 2015-04-12 DIAGNOSIS — R51 Headache: Secondary | ICD-10-CM

## 2015-04-12 DIAGNOSIS — Z9861 Coronary angioplasty status: Secondary | ICD-10-CM | POA: Diagnosis not present

## 2015-04-12 DIAGNOSIS — I6789 Other cerebrovascular disease: Secondary | ICD-10-CM | POA: Diagnosis not present

## 2015-04-12 DIAGNOSIS — E1165 Type 2 diabetes mellitus with hyperglycemia: Secondary | ICD-10-CM | POA: Diagnosis present

## 2015-04-12 DIAGNOSIS — R519 Headache, unspecified: Secondary | ICD-10-CM | POA: Diagnosis present

## 2015-04-12 DIAGNOSIS — E785 Hyperlipidemia, unspecified: Secondary | ICD-10-CM | POA: Diagnosis present

## 2015-04-12 DIAGNOSIS — Z720 Tobacco use: Secondary | ICD-10-CM

## 2015-04-12 DIAGNOSIS — Z8249 Family history of ischemic heart disease and other diseases of the circulatory system: Secondary | ICD-10-CM | POA: Diagnosis not present

## 2015-04-12 DIAGNOSIS — F329 Major depressive disorder, single episode, unspecified: Secondary | ICD-10-CM | POA: Diagnosis not present

## 2015-04-12 DIAGNOSIS — I2510 Atherosclerotic heart disease of native coronary artery without angina pectoris: Secondary | ICD-10-CM

## 2015-04-12 DIAGNOSIS — G43109 Migraine with aura, not intractable, without status migrainosus: Secondary | ICD-10-CM | POA: Diagnosis present

## 2015-04-12 DIAGNOSIS — R200 Anesthesia of skin: Secondary | ICD-10-CM | POA: Diagnosis present

## 2015-04-12 DIAGNOSIS — I252 Old myocardial infarction: Secondary | ICD-10-CM | POA: Diagnosis not present

## 2015-04-12 DIAGNOSIS — G43909 Migraine, unspecified, not intractable, without status migrainosus: Secondary | ICD-10-CM | POA: Diagnosis present

## 2015-04-12 DIAGNOSIS — Z6828 Body mass index (BMI) 28.0-28.9, adult: Secondary | ICD-10-CM | POA: Diagnosis not present

## 2015-04-12 DIAGNOSIS — I639 Cerebral infarction, unspecified: Secondary | ICD-10-CM | POA: Diagnosis present

## 2015-04-12 DIAGNOSIS — Z86711 Personal history of pulmonary embolism: Secondary | ICD-10-CM | POA: Diagnosis not present

## 2015-04-12 DIAGNOSIS — R42 Dizziness and giddiness: Secondary | ICD-10-CM | POA: Diagnosis not present

## 2015-04-12 DIAGNOSIS — Z955 Presence of coronary angioplasty implant and graft: Secondary | ICD-10-CM | POA: Diagnosis not present

## 2015-04-12 DIAGNOSIS — R29702 NIHSS score 2: Secondary | ICD-10-CM | POA: Diagnosis present

## 2015-04-12 DIAGNOSIS — R402362 Coma scale, best motor response, obeys commands, at arrival to emergency department: Secondary | ICD-10-CM | POA: Diagnosis present

## 2015-04-12 DIAGNOSIS — Z87828 Personal history of other (healed) physical injury and trauma: Secondary | ICD-10-CM | POA: Diagnosis not present

## 2015-04-12 DIAGNOSIS — I10 Essential (primary) hypertension: Secondary | ICD-10-CM | POA: Diagnosis present

## 2015-04-12 DIAGNOSIS — R402252 Coma scale, best verbal response, oriented, at arrival to emergency department: Secondary | ICD-10-CM | POA: Diagnosis present

## 2015-04-12 DIAGNOSIS — R402142 Coma scale, eyes open, spontaneous, at arrival to emergency department: Secondary | ICD-10-CM | POA: Diagnosis present

## 2015-04-12 DIAGNOSIS — Z7982 Long term (current) use of aspirin: Secondary | ICD-10-CM | POA: Diagnosis not present

## 2015-04-12 DIAGNOSIS — H5500 Unspecified nystagmus: Secondary | ICD-10-CM | POA: Diagnosis present

## 2015-04-12 DIAGNOSIS — E669 Obesity, unspecified: Secondary | ICD-10-CM | POA: Diagnosis present

## 2015-04-12 LAB — COMPREHENSIVE METABOLIC PANEL
ALBUMIN: 3.7 g/dL (ref 3.5–5.0)
ALK PHOS: 85 U/L (ref 38–126)
ALT: 35 U/L (ref 17–63)
ANION GAP: 8 (ref 5–15)
AST: 24 U/L (ref 15–41)
BUN: 8 mg/dL (ref 6–20)
CALCIUM: 9 mg/dL (ref 8.9–10.3)
CHLORIDE: 108 mmol/L (ref 101–111)
CO2: 25 mmol/L (ref 22–32)
CREATININE: 1.04 mg/dL (ref 0.61–1.24)
GFR calc non Af Amer: >60 mL/min (ref >60)
GLUCOSE: 133 mg/dL — AB (ref 65–99)
Potassium: 4.5 mmol/L (ref 3.5–5.1)
SODIUM: 141 mmol/L (ref 135–145)
Total Bilirubin: 0.9 mg/dL (ref 0.3–1.2)
Total Protein: 6.6 g/dL (ref 6.5–8.1)

## 2015-04-12 LAB — LIPID PANEL
CHOL/HDL RATIO: 3.2 ratio
Cholesterol: 122 mg/dL (ref 0–200)
HDL: 38 mg/dL — ABNORMAL LOW (ref >40)
LDL Cholesterol: 73 mg/dL (ref 0–99)
Triglycerides: 53 mg/dL (ref <150)
VLDL: 11 mg/dL (ref 0–40)

## 2015-04-12 LAB — GLUCOSE, CAPILLARY
GLUCOSE-CAPILLARY: 119 mg/dL — AB (ref 65–99)
GLUCOSE-CAPILLARY: 122 mg/dL — AB (ref 65–99)

## 2015-04-12 LAB — CBC
HCT: 48.6 % (ref 39.0–52.0)
HEMOGLOBIN: 16.2 g/dL (ref 13.0–17.0)
MCH: 29.1 pg (ref 26.0–34.0)
MCHC: 33.3 g/dL (ref 30.0–36.0)
MCV: 87.3 fL (ref 78.0–100.0)
Platelets: 180 10*3/uL (ref 150–400)
RBC: 5.57 MIL/uL (ref 4.22–5.81)
RDW: 12.2 % (ref 11.5–15.5)
WBC: 9.7 10*3/uL (ref 4.0–10.5)

## 2015-04-12 LAB — LIPASE, BLOOD: Lipase: 34 U/L (ref 11–51)

## 2015-04-12 MED ORDER — BUPROPION HCL ER (SR) 150 MG PO TB12: 150.0000 mg | ORAL_TABLET | Freq: Every day | ORAL | Status: DC

## 2015-04-12 MED ORDER — CARVEDILOL 3.125 MG PO TABS
3.1250 mg | ORAL_TABLET | Freq: Two times a day (BID) | ORAL | Status: DC
  Administered 2015-04-12 – 2015-04-14 (×5): 3.125 mg via ORAL
  Filled 2015-04-12 (×5): qty 1

## 2015-04-12 MED ORDER — SODIUM CHLORIDE 0.9 % IV SOLN
INTRAVENOUS | Status: AC
  Administered 2015-04-12: 02:00:00 via INTRAVENOUS

## 2015-04-12 MED ORDER — ATORVASTATIN CALCIUM 80 MG PO TABS
80.0000 mg | ORAL_TABLET | Freq: Every day | ORAL | Status: DC
  Administered 2015-04-12 – 2015-04-14 (×3): 80 mg via ORAL
  Filled 2015-04-12 (×3): qty 1

## 2015-04-12 MED ORDER — ADULT MULTIVITAMIN W/MINERALS CH
1.0000 | ORAL_TABLET | Freq: Every day | ORAL | Status: DC
  Administered 2015-04-12 – 2015-04-14 (×3): 1 via ORAL
  Filled 2015-04-12 (×3): qty 1

## 2015-04-12 MED ORDER — ONDANSETRON HCL 4 MG/2ML IJ SOLN
4.0000 mg | Freq: Three times a day (TID) | INTRAMUSCULAR | Status: DC | PRN
  Filled 2015-04-12 (×2): qty 2

## 2015-04-12 MED ORDER — ASPIRIN 325 MG PO TABS
325.0000 mg | ORAL_TABLET | Freq: Every day | ORAL | Status: DC
  Administered 2015-04-12 – 2015-04-14 (×3): 325 mg via ORAL
  Filled 2015-04-12 (×3): qty 1

## 2015-04-12 MED ORDER — STROKE: EARLY STAGES OF RECOVERY BOOK
Freq: Once | Status: DC
  Filled 2015-04-12: qty 1

## 2015-04-12 MED ORDER — ACETAMINOPHEN 325 MG PO TABS
650.0000 mg | ORAL_TABLET | Freq: Four times a day (QID) | ORAL | Status: DC | PRN
  Administered 2015-04-12: 650 mg via ORAL
  Filled 2015-04-12: qty 2

## 2015-04-12 MED ORDER — CALCIUM CARBONATE ANTACID 500 MG PO CHEW: 1.0000 | CHEWABLE_TABLET | Freq: Every day | ORAL | Status: DC | PRN

## 2015-04-12 MED ORDER — HYDROCODONE-ACETAMINOPHEN 7.5-325 MG PO TABS
1.0000 | ORAL_TABLET | Freq: Four times a day (QID) | ORAL | Status: DC | PRN
  Administered 2015-04-12: 1 via ORAL
  Filled 2015-04-12: qty 1

## 2015-04-12 MED ORDER — DIPHENHYDRAMINE HCL 50 MG/ML IJ SOLN: 12.5000 mg | Freq: Four times a day (QID) | INTRAMUSCULAR | Status: DC | PRN

## 2015-04-12 MED ORDER — PROCHLORPERAZINE EDISYLATE 5 MG/ML IJ SOLN: 10.0000 mg | Freq: Four times a day (QID) | INTRAMUSCULAR | Status: DC | PRN

## 2015-04-12 MED ORDER — ASPIRIN 325 MG PO TABS: 325.0000 mg | ORAL_TABLET | Freq: Every day | ORAL | Status: DC

## 2015-04-12 MED ORDER — ASPIRIN EC 81 MG PO TBEC: 81.0000 mg | DELAYED_RELEASE_TABLET | Freq: Every day | ORAL | Status: DC

## 2015-04-12 MED ORDER — NICOTINE 21 MG/24HR TD PT24
21.0000 mg | MEDICATED_PATCH | Freq: Every day | TRANSDERMAL | Status: DC
  Filled 2015-04-12: qty 1

## 2015-04-12 MED ORDER — IBUPROFEN 200 MG PO TABS
200.0000 mg | ORAL_TABLET | Freq: Four times a day (QID) | ORAL | Status: DC | PRN
  Administered 2015-04-12 – 2015-04-13 (×3): 200 mg via ORAL
  Filled 2015-04-12 (×3): qty 1

## 2015-04-12 MED ORDER — ASPIRIN 300 MG RE SUPP: 300.0000 mg | Freq: Every day | RECTAL | Status: DC

## 2015-04-12 MED ORDER — ONDANSETRON HCL 4 MG/2ML IJ SOLN: 4.0000 mg | Freq: Three times a day (TID) | INTRAMUSCULAR | Status: DC | PRN

## 2015-04-12 MED ORDER — ACETAMINOPHEN 650 MG RE SUPP: 650.0000 mg | Freq: Four times a day (QID) | RECTAL | Status: DC | PRN

## 2015-04-12 MED ORDER — ENOXAPARIN SODIUM 40 MG/0.4ML ~~LOC~~ SOLN
40.0000 mg | SUBCUTANEOUS | Status: DC
  Administered 2015-04-12 – 2015-04-13 (×2): 40 mg via SUBCUTANEOUS
  Filled 2015-04-12 (×2): qty 0.4

## 2015-04-12 MED ORDER — NITROGLYCERIN 0.4 MG SL SUBL: 0.4000 mg | SUBLINGUAL_TABLET | SUBLINGUAL | Status: DC | PRN

## 2015-04-12 MED ORDER — SODIUM CHLORIDE 0.9% FLUSH
3.0000 mL | Freq: Two times a day (BID) | INTRAVENOUS | Status: DC
Start: 2015-04-12 — End: 2015-04-13
  Administered 2015-04-12 – 2015-04-13 (×4): 3 mL via INTRAVENOUS

## 2015-04-12 MED ORDER — ISOSORBIDE MONONITRATE ER 30 MG PO TB24
30.0000 mg | ORAL_TABLET | Freq: Every day | ORAL | Status: DC
  Administered 2015-04-12 – 2015-04-14 (×3): 30 mg via ORAL
  Filled 2015-04-12 (×3): qty 1

## 2015-04-12 NOTE — Progress Notes (Signed)
Patient received from the ER at 0205, alert and oriented. MAEW. VSS. No numbness noted at this time. Will monitor closely overnight.

## 2015-04-12 NOTE — Progress Notes (Signed)
STROKE TEAM PROGRESS NOTE   HISTORY OF PRESENT ILLNESS Jonny RuizJohn Lebeck is a 50 y.o. male with a history of hypertension, coronary artery disease and myocardial infarction who was brought to the emergency room following acute onset of severe headache with associated vertigo and nausea. The symptoms reportedly were preceded by speech difficulty and a complaint of numbness involving the left side of his face. He was last known well at 8:45 PM tonight. He has no history of severe headaches nor history of stroke or TIA. CT scan of his head showed no acute intracranial abnormality. CT angiogram of head and neck was negative for aneurysm or vascular malformation. He was also negative for vascular obstruction or significant stenosis.. Clinical examination was unremarkable except for mild left facial numbness. Speech was normal. There was no extremity weakness.   SUBJECTIVE (INTERVAL HISTORY) His wife is at the bedside.  Overall he feels his condition is unchanged. He complains of headache, nausea and had emesis yesterday.  He has experienced diaphoresis as well   OBJECTIVE Temp:  [97.4 F (36.3 C)-97.8 F (36.6 C)] 97.6 F (36.4 C) (03/25 0600) Pulse Rate:  [57-77] 70 (03/25 0600) Cardiac Rhythm:  [-] Normal sinus rhythm (03/25 0210) Resp:  [15-30] 16 (03/25 0207) BP: (104-137)/(56-90) 119/66 mmHg (03/25 0600) SpO2:  [94 %-99 %] 96 % (03/25 0600) Weight:  [89.086 kg (196 lb 6.4 oz)] 89.086 kg (196 lb 6.4 oz) (03/25 0207)  CBC:  Recent Labs Lab 04/11/15 2150 04/11/15 2210 04/12/15 0620  WBC 11.5*  --  9.7  NEUTROABS 5.8  --   --   HGB 17.5* 18.7* 16.2  HCT 50.5 55.0* 48.6  MCV 85.6  --  87.3  PLT 223  --  180    Basic Metabolic Panel:  Recent Labs Lab 04/11/15 2150 04/11/15 2210 04/12/15 0620  NA 140 143 141  K 3.5 3.4* 4.5  CL 107 105 108  CO2 20*  --  25  GLUCOSE 143* 139* 133*  BUN 9 10 8   CREATININE 1.22 1.00 1.04  CALCIUM 9.4  --  9.0    Lipid Panel:    Component Value  Date/Time   CHOL 122 04/12/2015 0620   TRIG 53 04/12/2015 0620   HDL 38* 04/12/2015 0620   CHOLHDL 3.2 04/12/2015 0620   VLDL 11 04/12/2015 0620   LDLCALC 73 04/12/2015 0620   HgbA1c: No results found for: HGBA1C Urine Drug Screen: No results found for: LABOPIA, COCAINSCRNUR, LABBENZ, AMPHETMU, THCU, LABBARB    IMAGING  Ct Angio Head W/cm &/or Wo Cm 04/11/2015   Negative CTA head and neck.   Ct Head Wo Contrast 04/11/2015   No acute intracranial pathology.    Ct Angio Neck W/cm &/or Wo/cm 04/11/2015   Negative CTA head and neck.   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   PHYSICAL EXAM General - Well nourished, well developed, in NAD   Cardiovascular - Regular rate and rhythm Pulmonary: CTA Abdomen: NT, ND, normal bowel sounds Extremities: No C/C/E  Neurological Exam Mental Status: Normal Orientation:  Oriented to person, place and time Speech:  Fluent; no dysarthria Cranial Nerves:  PERRL; EOMI; visual fields full, face grossly symmetric, hearing grossly intact; shrug symmetric and tongue midline  Motor Exam:  Tone:  Within normal limits; Strength: 5/5 throughout  Sensory: Intact to light touch throughout  Coordination:  Intact finger to nose; heel-to shin normal  Gait: Deferred   ASSESSMENT/PLAN Mr.  Korban Shearer is a 50 y.o. male with history of Coronary artery disease with previous MI, ongoing tobacco use, hypertension, and history of pulmonary embolism  presenting with acute onset of a severe headache associated with nausea, vertigo, speech difficulties, and left facial numbness. He did not receive IV t-PA due to partial resolution of deficits.  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   MRA  Not  performed  Carotid Doppler  Refer to CTA of neck  CTA - negative CTA of head and neck  2D Echo  pending  UDS - pending  LDL 73  HgbA1c pending  VTE prophylaxis - Lovenox  Diet Heart Room service appropriate?: Yes; Fluid consistency:: Thin  aspirin 81 mg daily prior to admission, now on aspirin 325 mg daily  Patient counseled to be compliant with his antithrombotic medications  Ongoing aggressive stroke risk factor management  Therapy recommendations: pending  Disposition: Pending  Hypertension  Blood pressure tends to run low ( On Coreg 3.125 mg twice daily and Imdur 30 mg daily )  Permissive hypertension (OK if < 220/120) but gradually normalize in 5-7 days  Hyperlipidemia  Home meds: Lipitor 80 mg daily  resumed in hospital  LDL73, goal < 70  Continue statin at discharge  Diabetes  HgbA1c pending, goal < 7.0  Uncontrolled  Other Stroke Risk Factors  Cigarette smoker, advised to stop smoking  Obesity, Body mass index is 28.99 kg/(m^2).   Coronary artery disease   Other Active Problems   Hospital day #    ATTENDING NOTE: Patient was seen and examined by me personally. Documentation reflects findings. The laboratory and radiographic studies reviewed by me. ROS completed by me personally and pertinent positives fully documented  Condition:  Stable  Assessment and plan completed by me personally and fully documented above. Plans/Recommendations include:     Stroke work-up ongoing  PT at bedside at end of visit; will review evaluation  SIGNED BY: Dr. Sula Soda      To contact Stroke Continuity provider, please refer to [email or web address]. After hours, contact General Neurology

## 2015-04-12 NOTE — Progress Notes (Signed)
[facility] PROGRESS NOTE  Carl Sanchez ZOX:[id number] DOB: 06-22-1965 DOA: 04/11/2015 PCP: No PCP Per Patient  Assessment/Plan:  Stroke: Patient presented with dizziness, headache and right ear numbness. Etiology was not clear. CT head and CTA of the head and neck were negative. Neurology was consulted, Dr. Roseanne Reno saw patient patient, thought this is likely due to complicated migraine, but stroke cannot be completely ruled out. MRI confirmed PICA stroke.  - currently admitted as obs will poss need change to inpt status -Hold Wellbutrin - Risk factor modification: HgbA1c, fasting lipid panel  - MRA of the brain without contrast  - PT consult, OT consult, Speech consult  - 2 d Echocardiogram  - Aspirin - check UDS - permissive htn  Tobacco abuse: -Did counseling about importance of quitting smoking -Nicotine patch  Essential hypertension: -Continue Coreg and lisinopril - controlled will monitor  HLD: Last LDL was not on record -Continue home medications: Lipitor - LDL at goal  CAD: s/p of stent. No chest pain -Continue aspirin, Lipitor, Imdur and Coreg  DVT ppx: SQ Lovenox  Code Status: Full code Family Communication: None (indicate person spoken with, relationship, and if by phone, the number) Disposition Plan: To be determined   Consultants:  Neurology  Procedures:  None  Antibiotics:  None (indicate start date, and stop date if known)  HPI/Subjective: Patient states that he has headache and occasional sensation of dizziness when he moves. Patient states that as long as he stays perfectly still there is no movement. With movement it feels as if the world is spinning around him. Patient denies any new focal neurological deficits. Denies fever or chest pain shortness of breath leg pain vomiting diarrhea.  Objective: Filed Vitals:   04/12/15 0927 04/12/15 1325  BP: 140/93 104/69  Pulse: 68 77  Temp: 98.6 F (37 C) 98.2 F (36.8 C)  Resp: 18 18    No intake or output data in the 24 hours ending 04/12/15 1557 Filed Weights   04/12/15 0207  Weight: 89.086 kg (196 lb 6.4 oz)    Exam:   General:  No diaphoresis, anxious, no acute distress  Cardiovascular: Regular rate and rhythm no murmurs rubs or gallops  Respiratory: Clear to auscultation bilaterally no more breathing  Abdomen: Nondistended bowel sounds normal nontender palpation  Musculoskeletal: Moving all extremities, no deformity, 5 out of 5 strength   Data Reviewed: Basic Metabolic Panel:  Recent Labs Lab 04/11/15 2150 04/11/15 2210 04/12/15 0620  NA 140 143 141  K 3.5 3.4* 4.5  CL 107 105 108  CO2 20*  --  25  GLUCOSE 143* 139* 133*  BUN CREATININE 1.22 1.00 1.04  CALCIUM 9.4  --  9.0   Liver Function Tests:  Recent Labs Lab 04/11/15 2150 04/12/15 0620  AST 31 24  ALT 36 35  ALKPHOS 93 85  BILITOT 1.0 0.9  PROT 7.2 6.6  ALBUMIN 4.0 3.7    Recent Labs Lab 04/11/15 0102  LIPASE 34   No results for input(s): AMMONIA in the last 168 hours. CBC:  Recent Labs Lab 04/11/15 2150 04/11/15 2210 04/12/15 0620  WBC 11.5*  --  9.7  NEUTROABS 5.8  --   --   HGB 17.5* 18.7* 16.2  HCT 50.5 55.0* 48.6  MCV 85.6  --  87.3  PLT 223  --  180   Cardiac Enzymes: No results for input(s): CKTOTAL, CKMB, CKMBINDEX, TROPONINI in the last 168 hours. BNP (last 3 results) No results for  input(s): BNP in the last 8760 hours.  ProBNP (last 3 results) No results for input(s): PROBNP in the last 8760 hours.  CBG:  Recent Labs Lab 04/11/15 2209 04/12/15 0652 04/12/15 0819  GLUCAP 125* 119* 122*    No results found for this or any previous visit (from the past 240 hour(s)).   Studies: Ct Angio Head W/cm &/or Wo Cm  04/11/2015  CLINICAL DATA:  Difficulty walking, dizziness and headache. Followup stroke. History of hypertension, myocardial infarction, pulmonary embolism, hyperlipidemia. EXAM: CT ANGIOGRAPHY HEAD AND NECK TECHNIQUE:  Multidetector CT imaging of the head and neck was performed using the standard protocol during bolus administration of intravenous contrast. Multiplanar CT image reconstructions and MIPs were obtained to evaluate the vascular anatomy. Carotid stenosis measurements (when applicable) are obtained utilizing NASCET criteria, using the distal internal carotid diameter as the denominator. CONTRAST:  50mL OMNIPAQUE IOHEXOL 350 MG/ML SOLN COMPARISON:  CT head April 11, 2015 at 2202 hours FINDINGS: CTA NECK Aortic arch: Normal appearance of the thoracic arch, normal branch pattern. The origins of the innominate, left Common carotid artery and subclavian artery are widely patent. Right carotid system: Common carotid artery is widely patent, coursing in a straight line fashion. Normal appearance of the carotid bifurcation without hemodynamically significant stenosis by NASCET criteria. Normal appearance of the included internal carotid artery. Left carotid system: Common carotid artery is widely patent, coursing in a straight line fashion. Normal appearance of the carotid bifurcation without hemodynamically significant stenosis by NASCET criteria. Normal appearance of the included internal carotid artery. Vertebral arteries:Left vertebral artery is dominant. Normal appearance of the vertebral arteries, which appear widely patent. Skeleton: No acute osseous process though bone windows have not been submitted. Moderate to severe C5-6 and C6-7 disc height loss, mild at C4-5 with uncovertebral hypertrophy/endplate spurring resulting in severe LEFT greater than RIGHT C5-6 neural foraminal narrowing. Other neck: Soft tissues of the neck are nonacute though, not tailored for evaluation. RIGHT upper lobe calcified granuloma. CTA HEAD Anterior circulation: Normal appearance of the cervical internal carotid arteries, petrous, cavernous and supra clinoid internal carotid arteries. Widely patent anterior communicating artery. Normal  appearance of the anterior and middle cerebral arteries. Posterior circulation: Normal appearance of the vertebral arteries, vertebrobasilar junction and basilar artery, as well as main branch vessels. Patent normal bilateral posterior cerebral arteries. Small RIGHT posterior communicating artery present. No large vessel occlusion, hemodynamically significant stenosis, dissection, luminal irregularity, contrast extravasation or aneurysm within the anterior nor posterior circulation. IMPRESSION: Negative CTA head and neck. Electronically Signed   By: Awilda Metroourtnay  Bloomer M.D.   On: 04/11/2015 23:28   Ct Head Wo Contrast  04/11/2015  CLINICAL DATA:  50 year old male with difficulty walking and dizziness and headache. Code stroke. EXAM: CT HEAD WITHOUT CONTRAST TECHNIQUE: Contiguous axial images were obtained from the base of the skull through the vertex without intravenous contrast. COMPARISON:  None. FINDINGS: The ventricles and the sulci are appropriate in size for the patient's age. There is no intracranial hemorrhage. No midline shift or mass effect identified. The gray-white matter differentiation is preserved. There is high attenuation of the visualized portions of the MCA bilaterally, likely related to hemoconcentration. The visualized paranasal sinuses and mastoid air cells are well aerated. The calvarium is intact. IMPRESSION: No acute intracranial pathology. These results were called by telephone at the time of interpretation on 04/11/2015 at 10:11 pm to Dr. Roseanne RenoStewart, who verbally acknowledged these results. Electronically Signed   By: Elgie CollardArash  Radparvar M.D.   On: 04/11/2015  22:12   Ct Angio Neck W/cm &/or Wo/cm  04/11/2015  CLINICAL DATA:  Difficulty walking, dizziness and headache. Followup stroke. History of hypertension, myocardial infarction, pulmonary embolism, hyperlipidemia. EXAM: CT ANGIOGRAPHY HEAD AND NECK TECHNIQUE: Multidetector CT imaging of the head and neck was performed using the standard  protocol during bolus administration of intravenous contrast. Multiplanar CT image reconstructions and MIPs were obtained to evaluate the vascular anatomy. Carotid stenosis measurements (when applicable) are obtained utilizing NASCET criteria, using the distal internal carotid diameter as the denominator. CONTRAST:  50mL OMNIPAQUE IOHEXOL 350 MG/ML SOLN COMPARISON:  CT head April 11, 2015 at 2202 hours FINDINGS: CTA NECK Aortic arch: Normal appearance of the thoracic arch, normal branch pattern. The origins of the innominate, left Common carotid artery and subclavian artery are widely patent. Right carotid system: Common carotid artery is widely patent, coursing in a straight line fashion. Normal appearance of the carotid bifurcation without hemodynamically significant stenosis by NASCET criteria. Normal appearance of the included internal carotid artery. Left carotid system: Common carotid artery is widely patent, coursing in a straight line fashion. Normal appearance of the carotid bifurcation without hemodynamically significant stenosis by NASCET criteria. Normal appearance of the included internal carotid artery. Vertebral arteries:Left vertebral artery is dominant. Normal appearance of the vertebral arteries, which appear widely patent. Skeleton: No acute osseous process though bone windows have not been submitted. Moderate to severe C5-6 and C6-7 disc height loss, mild at C4-5 with uncovertebral hypertrophy/endplate spurring resulting in severe LEFT greater than RIGHT C5-6 neural foraminal narrowing. Other neck: Soft tissues of the neck are nonacute though, not tailored for evaluation. RIGHT upper lobe calcified granuloma. CTA HEAD Anterior circulation: Normal appearance of the cervical internal carotid arteries, petrous, cavernous and supra clinoid internal carotid arteries. Widely patent anterior communicating artery. Normal appearance of the anterior and middle cerebral arteries. Posterior circulation:  Normal appearance of the vertebral arteries, vertebrobasilar junction and basilar artery, as well as main branch vessels. Patent normal bilateral posterior cerebral arteries. Small RIGHT posterior communicating artery present. No large vessel occlusion, hemodynamically significant stenosis, dissection, luminal irregularity, contrast extravasation or aneurysm within the anterior nor posterior circulation. IMPRESSION: Negative CTA head and neck. Electronically Signed   By: Awilda Metro M.D.   On: 04/11/2015 23:28   Mr Shirlee Latch Wo Contrast  04/12/2015  CLINICAL DATA:  50 year old male with small acute right cerebellar PICA territory infarct following presentation of headache and dizziness. Initial encounter. EXAM: MRA HEAD WITHOUT CONTRAST TECHNIQUE: Angiographic images of the Circle of Willis were obtained using MRA technique without intravenous contrast. COMPARISON:  Brain MRI 0439 hours today. CTA head and neck 04/11/2015. FINDINGS: Antegrade flow in the posterior circulation with codominant distal vertebral arteries. No distal vertebral artery stenosis. Patent right PICA origin on series 3, image 32. Normal left PICA origin. Patent vertebrobasilar junction. No basilar artery stenosis. SCA and PCA origins are normal. Posterior communicating arteries are diminutive or absent. Bilateral PCA branches are normal. Antegrade flow in both ICA siphons. No siphon stenosis. Normal ophthalmic artery origins. Normal carotid termini, MCA and ACA origins. Diminutive anterior communicating artery. Visualized bilateral ACA branches are within normal limits. Bilateral MCA M1 segments, MCA bifurcations, and visualized bilateral MCA branches are within normal limits. No posterior fossa mass effect. IMPRESSION: Negative intracranial MRA.  Patent right PICA origin. Electronically Signed   By: Odessa Fleming M.D.   On: 04/12/2015 11:14   Mr Brain Wo Contrast  04/12/2015  CLINICAL DATA:  Dizziness and headache after getting  out of  shower yesterday evening. Vomiting. History of hypertension, pulmonary embolism, hyperlipidemia. EXAM: MRI HEAD WITHOUT CONTRAST TECHNIQUE: Multiplanar, multiecho pulse sequences of the brain and surrounding structures were obtained without intravenous contrast. COMPARISON:  CT head April 11, 2015 FINDINGS: The ventricles and sulci are normal for patient's age. No abnormal parenchymal signal, mass lesions, mass effect. Patchy reduced diffusion RIGHT mesial inferior cerebellum with low ADC values and FLAIR T2 hyperintense signal. No susceptibility artifact to suggest hemorrhage. No abnormal extra-axial fluid collections. No extra-axial masses though, contrast enhanced sequences would be more sensitive. Normal major intracranial vascular flow voids seen at the skull base. Ocular globes and orbital contents are unremarkable though not tailored for evaluation. No abnormal sellar expansion. No suspicious calvarial bone marrow signal. Craniocervical junction maintained. Mild paranasal sinus mucosal thickening. Trace RIGHT mastoid effusion. IMPRESSION: Acute small RIGHT inferior cerebellum (posterior-inferior cerebellar artery territory) infarct. Electronically Signed   By: Awilda Metro M.D.   On: 04/12/2015 05:24    Scheduled Meds: .  stroke: mapping our early stages of recovery book   Does not apply Once  . aspirin  300 mg Rectal Daily   Or  . aspirin  325 mg Oral Daily  . atorvastatin  80 mg Oral Daily  . carvedilol  3.125 mg Oral BID WC  . enoxaparin (LOVENOX) injection  40 mg Subcutaneous Q24H  . isosorbide mononitrate  30 mg Oral Daily  . multivitamin with minerals  1 tablet Oral Daily  . nicotine  21 mg Transdermal Daily  . sodium chloride flush  3 mL Intravenous Q12H   Continuous Infusions:   Principal Problem:   Complicated migraine Active Problems:   Tobacco abuse   Essential hypertension   Hyperlipidemia with target LDL less than 70   CAD s/p MI and PCI of LAD(Xience 2.5 x 18 mm) in  2014   Headache   Dizziness   Cerebellar stroke (HCC)    Time spent: 20    Haydee Salter  [facility] Pager [phone]. . If 7PM-7AM, please contact night-coverage at [email or web address], password [facility] 04/12/2015, 3:57 PM
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
## 2015-04-12 NOTE — Progress Notes (Signed)
Inpatient Rehabilitation  Patient was screened by Fae PippinMelissa Shanan Fitzpatrick for appropriateness for an Inpatient Acute Rehab consult.  At this time the patient remains under observation status.  If the patient changes to inpatient status we would recommend an Inpatient Rehab consult.  MD please order when appropriate, if you are agreeable.    Charlane FerrettiMelissa Aodhan Scheidt, M.A., CCC/SLP Admission Coordinator  [facility]  Cell [phone]

## 2015-04-12 NOTE — Evaluation (Signed)
Physical Therapy Evaluation Patient Details Name: Carl Sanchez MRN: [id number] DOB: [birth year] Today's Date: 04/12/2015   History of Present Illness  Carl Sanchez is a 50 y.o. male with a history of hypertension, coronary artery disease and myocardial infarction who was brought to the emergency room following acute onset of severe headache with associated vertigo and nausea. The symptoms reportedly were preceded by speech difficulty and a complaint of numbness involving the left side of his face. 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  Clinical Impression  Pt admitted with above. Pt with severe dizziness and nausea with movement due to cerebellar infarct. Pt does well with focusing on non-moving object during movement and was able to amb 200' with RW. Pt with noted vision deficits as well. Recommend OT consult to further assess. Recommend CIR at this time as pt was indep and working PTA.    Follow Up Recommendations CIR;Supervision/Assistance - 24 hour, may progress well enough for HHPT    Equipment Recommendations  Rolling walker with 5" wheels    Recommendations for Other Services Rehab consult     Precautions / Restrictions Precautions Precautions: Fall (vertigo from infarct) Precaution Comments: tell pt to focus on non-moving object when he's moving Restrictions Weight Bearing Restrictions: No      Mobility  Bed Mobility Overal bed mobility: Needs Assistance Bed Mobility: Supine to Sit     Supine to sit: Supervision     General bed mobility comments: due to dizziness with all movement instructed pt to focus on non-moving object and he did well. no physical assist needed. increased time and HOB elevated  Transfers Overall transfer level: Needs assistance Equipment used: Rolling walker (2 wheeled) Transfers: Sit to/from Stand Sit to Stand: Min assist         General transfer comment: v/c's to focuse on non-moving object and push up from  bed  Ambulation/Gait Ambulation/Gait assistance: Min assist Ambulation Distance (Feet): 200 Feet Assistive device: Rolling walker (2 wheeled) Gait Pattern/deviations: Step-through pattern;Decreased stride length;Narrow base of support Gait velocity: decreased Gait velocity interpretation: Below normal speed for age/gender General Gait Details: bilat LE external rotated, pt focused on non-moving objects and did well. reports "i feel like i'm walking through a tunnel" "things are going in and out"  Stairs            Wheelchair Mobility    Modified Rankin (Stroke Patients Only) Modified Rankin (Stroke Patients Only) Pre-Morbid Rankin Score: No symptoms Modified Rankin: Moderate disability     Balance Overall balance assessment: Needs assistance Sitting-balance support: Feet supported;Bilateral upper extremity supported Sitting balance-Leahy Scale: Fair     Standing balance support: Bilateral upper extremity supported Standing balance-Leahy Scale: Poor Standing balance comment: requires to focus on non-moving object and bilat UE support                             Pertinent Vitals/Pain Pain Assessment: No/denies pain    Home Living Family/patient expects to be discharged to:: Private residence Living Arrangements: Spouse/significant other Available Help at Discharge: Family;Available PRN/intermittently Type of Home: House Home Access: Level entry     Home Layout: Two level   Additional Comments: 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    Prior Function Level of Independence: Independent         Comments: pt working PTA     Hand Dominance   Dominant Hand: Right    Extremity/Trunk Assessment  Upper Extremity Assessment: Overall WFL for tasks assessed           Lower Extremity Assessment: Overall WFL for tasks assessed      Cervical / Trunk Assessment: Normal  Communication   Communication: No difficulties  Cognition  Arousal/Alertness: Awake/alert Behavior During Therapy: WFL for tasks assessed/performed Overall Cognitive Status: Within Functional Limits for tasks assessed                      General Comments General comments (skin integrity, edema, etc.): no formal vestibular function test done as pt with cerebellar infarct. Pt with noted dizziness with any movement therefore gave him compensation tactic to look at non-moving object which helped immensely    Exercises        Assessment/Plan    PT Assessment Patient needs continued PT services  PT Diagnosis Difficulty walking;Generalized weakness   PT Problem List Decreased strength;Decreased activity tolerance;Decreased balance;Decreased mobility;Decreased knowledge of use of DME;Decreased safety awareness  PT Treatment Interventions DME instruction;Gait training;Stair training;Functional mobility training;Therapeutic activities;Therapeutic exercise;Balance training;Neuromuscular re-education;Patient/family education   PT Goals (Current goals can be found in the Care Plan section) Acute Rehab PT Goals Patient Stated Goal: better asap PT Goal Formulation: With patient Time For Goal Achievement: 04/19/15 Potential to Achieve Goals: Good    Frequency Min 4X/week   Barriers to discharge Decreased caregiver support sig other works    Co-evaluation               End of Journalist, newspaperession Equipment Utilized During Treatment: Gait belt Activity Tolerance: Patient tolerated treatment well Patient left: in chair;with call bell/phone within reach;with family/visitor present Nurse Communication: Mobility status    Functional Assessment Tool Used: clinical judgement Functional Limitation: Mobility: Walking and moving around Mobility: Walking and Moving Around Current Status [phone](G8978): At least 60 percent but less than 80 percent impaired, limited or restricted Mobility: Walking and Moving Around Goal Status [phone](G8979): At least 1 percent but less  than 20 percent impaired, limited or restricted    Time: [id number] PT Time Calculation (min) (ACUTE ONLY): 36 min   Charges:   PT Evaluation $PT Eval Moderate Complexity: 1 Procedure PT Treatments $Gait Training: 8-22 mins   PT G Codes:   PT G-Codes **NOT FOR INPATIENT CLASS** Functional Assessment Tool Used: clinical judgement Functional Limitation: Mobility: Walking and moving around Mobility: Walking and Moving Around Current Status ([id number]): At least 60 percent but less than 80 percent impaired, limited or restricted Mobility: Walking and Moving Around Goal Status [phone](G8979): At least 1 percent but less than 20 percent impaired, limited or restricted    Marcene BrawnChadwell, Jaquann Guarisco Marie 04/12/2015, 4:53 PM  Lewis ShockAshly Demarian Epps, PT, DPT Pager #: [phone] Office #: [phone]

## 2015-04-13 ENCOUNTER — Inpatient Hospital Stay (HOSPITAL_COMMUNITY): Payer: BLUE CROSS/BLUE SHIELD

## 2015-04-13 DIAGNOSIS — I6789 Other cerebrovascular disease: Secondary | ICD-10-CM

## 2015-04-13 DIAGNOSIS — I639 Cerebral infarction, unspecified: Secondary | ICD-10-CM | POA: Diagnosis present

## 2015-04-13 LAB — BASIC METABOLIC PANEL
Anion gap: 7 (ref 5–15)
BUN: 7 mg/dL (ref 6–20)
CHLORIDE: 106 mmol/L (ref 101–111)
CO2: 29 mmol/L (ref 22–32)
CREATININE: 0.94 mg/dL (ref 0.61–1.24)
Calcium: 8.8 mg/dL — ABNORMAL LOW (ref 8.9–10.3)
GFR calc Af Amer: >60 mL/min (ref >60)
GFR calc non Af Amer: >60 mL/min (ref >60)
GLUCOSE: 100 mg/dL — AB (ref 65–99)
Potassium: 3.6 mmol/L (ref 3.5–5.1)
Sodium: 142 mmol/L (ref 135–145)

## 2015-04-13 LAB — ANTITHROMBIN III: ANTITHROMB III FUNC: 107 % (ref 75–120)

## 2015-04-13 LAB — GLUCOSE, CAPILLARY: Glucose-Capillary: 90 mg/dL (ref 65–99)

## 2015-04-13 LAB — ECHOCARDIOGRAM COMPLETE
Height: 69 in
WEIGHTICAEL: 3142.4 [oz_av]

## 2015-04-13 MED ORDER — STROKE: EARLY STAGES OF RECOVERY BOOK
Freq: Once | Status: AC
  Administered 2015-04-13: 17:00:00
  Filled 2015-04-13: qty 1

## 2015-04-13 MED ORDER — POTASSIUM CHLORIDE CRYS ER 20 MEQ PO TBCR
30.0000 meq | EXTENDED_RELEASE_TABLET | Freq: Two times a day (BID) | ORAL | Status: AC
  Administered 2015-04-13 (×2): 30 meq via ORAL
  Filled 2015-04-13 (×2): qty 1

## 2015-04-13 NOTE — Progress Notes (Signed)
Physical Therapy Treatment Patient Details Name: Carl Sanchez MRN: [id number] DOB: 1965-10-01 Today's Date: 04/13/2015    History of Present Illness Carl Sanchez is a 50 y.o. male with a history of hypertension, coronary artery disease and myocardial infarction who was brought to the emergency room following acute onset of severe headache with associated vertigo and nausea. The symptoms reportedly were preceded by speech difficulty and a complaint of numbness involving the left side of his face. 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    PT Comments    Pt much improved this date. Pt denies dizziness with  Movement doesn't required compensatory technique of focusing on non-moving object. Pt scored 21 on DGI indicating minimal falls risk. Pt given VORx1 exercises to address mild dizziness with head turns. Pt progressing well enough to return home with intermittent supervision. Recommend outpt PT to address higher level balance and residual vestibular symptoms.  Follow Up Recommendations  Supervision/Assistance - 24 hour;Outpatient PT (for vestibular if doesn't progress)     Equipment Recommendations  None recommended by PT    Recommendations for Other Services       Precautions / Restrictions Precautions Precautions: Fall Restrictions Weight Bearing Restrictions: No    Mobility  Bed Mobility Overal bed mobility: Modified Independent Bed Mobility: Supine to Sit     Supine to sit: Supervision     General bed mobility comments: pt denies dizziness this date and didn't require focusing on non-moving object  Transfers Overall transfer level: Needs assistance Equipment used: None Transfers: Sit to/from Stand Sit to Stand: Supervision         General transfer comment: pt denies dizziness, no instability  Ambulation/Gait Ambulation/Gait assistance: Min guard Ambulation Distance (Feet): 350 Feet Assistive device: None Gait Pattern/deviations:  Step-through pattern Gait velocity: wfl Gait velocity interpretation: at or above normal speed for age/gender General Gait Details: mild instability with pertebations but able to catch self. pt reports some depth perception issues but overall no dizziness   Stairs Stairs: Yes Stairs assistance: Min guard Stair Management: No rails;One rail Left Number of Stairs: 10 General stair comments: pt alternating steps ascending, alternating steps with L hand rail descending  Wheelchair Mobility    Modified Rankin (Stroke Patients Only) Modified Rankin (Stroke Patients Only) Pre-Morbid Rankin Score: No symptoms Modified Rankin: No significant disability     Balance Overall balance assessment: Needs assistance                 Single Leg Stance - Left Leg:  (pt able to stand and lift R LE to fix socks while hold rail)                Cognition Arousal/Alertness: Awake/alert Behavior During Therapy: WFL for tasks assessed/performed Overall Cognitive Status: Within Functional Limits for tasks assessed                      Exercises      General Comments General comments (skin integrity, edema, etc.): pt given VOR x1 exercises      Pertinent Vitals/Pain Pain Assessment:  (mild headache)    Home Living                      Prior Function            PT Goals (current goals can now be found in the care plan section) Acute Rehab PT Goals Patient Stated Goal: home Progress towards PT goals: Progressing toward goals  Frequency  Min 4X/week    PT Plan Discharge plan needs to be updated    Co-evaluation             End of Session Equipment Utilized During Treatment: Gait belt Activity Tolerance: Patient tolerated treatment well Patient left: in chair;with call bell/phone within reach     Time: 1400-1425 PT Time Calculation (min) (ACUTE ONLY): 25 min  Charges:  $Gait Training: 8-22 mins $Therapeutic Activity: 8-22 mins                     G Codes:      Marcene BrawnChadwell, Enrrique Mierzwa Marie 04/13/2015, 3:14 PM   Lewis ShockAshly Rhesa Forsberg, PT, DPT Pager #: [phone] Office #: [phone]

## 2015-04-13 NOTE — Progress Notes (Signed)
Inpatient Rehabilitation  Per PT request patient was screened by Fae PippinMelissa Bishop Vanderwerf for appropriateness for an Inpatient Acute Rehab consult.  Note that a consult order has been placed.  Plan to follow up after MD consult has been completed.    Charlane FerrettiMelissa Othell Diluzio, M.A., CCC/SLP Admission Coordinator  [facility]  Cell [phone]

## 2015-04-13 NOTE — Progress Notes (Signed)
[facility] PROGRESS NOTE  Carl Sanchez ZOX:[id number] DOB: 11/20/1965 DOA: 04/11/2015 PCP: No PCP Per Patient  Assessment/Plan:  Stroke: Patient presented with dizziness, headache and right ear numbness. Etiology was not clear. CT head and CTA of the head and neck were negative. Neurology was consulted, Dr. Roseanne Reno saw patient patient, thought this is likely due to complicated migraine, but stroke cannot be completely ruled out. MRI confirmed PICA stroke.  - was obs and change to inpt status -Hold Wellbutrin - Risk factor modification: HgbA1c, fasting lipid panel  - MRA of the brain without contrast  - PT consult, OT consult, Speech consult - likely will need inpt rehab stay - 2 d Echocardiogram - waiting on read - Aspirin qd - check UDS - permissive htn  Tobacco abuse: -Did counseling about importance of quitting smoking -Nicotine patch  Essential hypertension: -Continue Coreg and lisinopril - controlled will monitor  HLD: Last LDL was not on record -Continue home medications: Lipitor - LDL at goal  CAD: s/p of stent. No chest pain -Continue aspirin, Lipitor, Imdur and Coreg  DVT ppx: SQ Lovenox  Code Status: Full code Family Communication: None (indicate person spoken with, relationship, and if by phone, the number) Disposition Plan: To be determined   Consultants:  Neurology  Procedures:  None  Antibiotics:  None (indicate start date, and stop date if known)  HPI/Subjective: Patient states that he has headache and occasional sensation of dizziness when he moves but less than yesterday. With movement it feels as if the world is spinning around him. Patient denies any new focal neurological deficits. Denies fever or chest pain shortness of breath leg pain vomiting diarrhea.  Objective: Filed Vitals:   04/13/15 0902 04/13/15 1408  BP: 122/89 102/74  Pulse: 79 85  Temp: 98.1 F (36.7 C) 98.3 F (36.8 C)  Resp: 18 18   No intake or output  data in the 24 hours ending 04/13/15 1458 Filed Weights   04/12/15 0207  Weight: 89.086 kg (196 lb 6.4 oz)    Exam:   General:  No diaphoresis, calm, NAD  Cardiovascular: Regular rate and rhythm no MRG  Respiratory: Clear to auscultation bilaterally nl wob  Abdomen: Nondistended bowel sounds normal nontender to palpation  Musculoskeletal: Moving all extremities, no deformity, 5 / 5 strength   Data Reviewed: Basic Metabolic Panel:  Recent Labs Lab 04/11/15 2150 04/11/15 2210 04/12/15 0620 04/13/15 0437  NA 140 143 141 142  K 3.5 3.4* 4.5 3.6  CL 107 105 108 106  CO2 20*  --  25 29  GLUCOSE 143* 139* 133* 100*  BUN CREATININE 1.22 1.00 1.04 0.94  CALCIUM 9.4  --  9.0 8.8*   Liver Function Tests:  Recent Labs Lab 04/11/15 2150 04/12/15 0620  AST 31 24  ALT 36 35  ALKPHOS 93 85  BILITOT 1.0 0.9  PROT 7.2 6.6  ALBUMIN 4.0 3.7    Recent Labs Lab 04/11/15 0102  LIPASE 34   No results for input(s): AMMONIA in the last 168 hours. CBC:  Recent Labs Lab 04/11/15 2150 04/11/15 2210 04/12/15 0620  WBC 11.5*  --  9.7  NEUTROABS 5.8  --   --   HGB 17.5* 18.7* 16.2  HCT 50.5 55.0* 48.6  MCV 85.6  --  87.3  PLT 223  --  180   Cardiac Enzymes: No results for input(s): CKTOTAL, CKMB, CKMBINDEX, TROPONINI in the last 168 hours. BNP (last 3 results) No results for  input(s): BNP in the last 8760 hours.  ProBNP (last 3 results) No results for input(s): PROBNP in the last 8760 hours.  CBG:  Recent Labs Lab 04/11/15 2209 04/12/15 0652 04/12/15 0819 04/13/15 0753  GLUCAP 125* 119* 122* 90    No results found for this or any previous visit (from the past 240 hour(s)).   Studies: Ct Angio Head W/cm &/or Wo Cm  04/11/2015  CLINICAL DATA:  Difficulty walking, dizziness and headache. Followup stroke. History of hypertension, myocardial infarction, pulmonary embolism, hyperlipidemia. EXAM: CT ANGIOGRAPHY HEAD AND NECK TECHNIQUE: Multidetector CT  imaging of the head and neck was performed using the standard protocol during bolus administration of intravenous contrast. Multiplanar CT image reconstructions and MIPs were obtained to evaluate the vascular anatomy. Carotid stenosis measurements (when applicable) are obtained utilizing NASCET criteria, using the distal internal carotid diameter as the denominator. CONTRAST:  50mL OMNIPAQUE IOHEXOL 350 MG/ML SOLN COMPARISON:  CT head April 11, 2015 at 2202 hours FINDINGS: CTA NECK Aortic arch: Normal appearance of the thoracic arch, normal branch pattern. The origins of the innominate, left Common carotid artery and subclavian artery are widely patent. Right carotid system: Common carotid artery is widely patent, coursing in a straight line fashion. Normal appearance of the carotid bifurcation without hemodynamically significant stenosis by NASCET criteria. Normal appearance of the included internal carotid artery. Left carotid system: Common carotid artery is widely patent, coursing in a straight line fashion. Normal appearance of the carotid bifurcation without hemodynamically significant stenosis by NASCET criteria. Normal appearance of the included internal carotid artery. Vertebral arteries:Left vertebral artery is dominant. Normal appearance of the vertebral arteries, which appear widely patent. Skeleton: No acute osseous process though bone windows have not been submitted. Moderate to severe C5-6 and C6-7 disc height loss, mild at C4-5 with uncovertebral hypertrophy/endplate spurring resulting in severe LEFT greater than RIGHT C5-6 neural foraminal narrowing. Other neck: Soft tissues of the neck are nonacute though, not tailored for evaluation. RIGHT upper lobe calcified granuloma. CTA HEAD Anterior circulation: Normal appearance of the cervical internal carotid arteries, petrous, cavernous and supra clinoid internal carotid arteries. Widely patent anterior communicating artery. Normal appearance of the  anterior and middle cerebral arteries. Posterior circulation: Normal appearance of the vertebral arteries, vertebrobasilar junction and basilar artery, as well as main branch vessels. Patent normal bilateral posterior cerebral arteries. Small RIGHT posterior communicating artery present. No large vessel occlusion, hemodynamically significant stenosis, dissection, luminal irregularity, contrast extravasation or aneurysm within the anterior nor posterior circulation. IMPRESSION: Negative CTA head and neck. Electronically Signed   By: Awilda Metro M.D.   On: 04/11/2015 23:28   Ct Head Wo Contrast  04/11/2015  CLINICAL DATA:  50 year old male with difficulty walking and dizziness and headache. Code stroke. EXAM: CT HEAD WITHOUT CONTRAST TECHNIQUE: Contiguous axial images were obtained from the base of the skull through the vertex without intravenous contrast. COMPARISON:  None. FINDINGS: The ventricles and the sulci are appropriate in size for the patient's age. There is no intracranial hemorrhage. No midline shift or mass effect identified. The gray-white matter differentiation is preserved. There is high attenuation of the visualized portions of the MCA bilaterally, likely related to hemoconcentration. The visualized paranasal sinuses and mastoid air cells are well aerated. The calvarium is intact. IMPRESSION: No acute intracranial pathology. These results were called by telephone at the time of interpretation on 04/11/2015 at 10:11 pm to Dr. Roseanne Reno, who verbally acknowledged these results. Electronically Signed   By: Ceasar Mons.D.  On: 04/11/2015 22:12   Ct Angio Neck W/cm &/or Wo/cm  04/11/2015  CLINICAL DATA:  Difficulty walking, dizziness and headache. Followup stroke. History of hypertension, myocardial infarction, pulmonary embolism, hyperlipidemia. EXAM: CT ANGIOGRAPHY HEAD AND NECK TECHNIQUE: Multidetector CT imaging of the head and neck was performed using the standard protocol during  bolus administration of intravenous contrast. Multiplanar CT image reconstructions and MIPs were obtained to evaluate the vascular anatomy. Carotid stenosis measurements (when applicable) are obtained utilizing NASCET criteria, using the distal internal carotid diameter as the denominator. CONTRAST:  50mL OMNIPAQUE IOHEXOL 350 MG/ML SOLN COMPARISON:  CT head April 11, 2015 at 2202 hours FINDINGS: CTA NECK Aortic arch: Normal appearance of the thoracic arch, normal branch pattern. The origins of the innominate, left Common carotid artery and subclavian artery are widely patent. Right carotid system: Common carotid artery is widely patent, coursing in a straight line fashion. Normal appearance of the carotid bifurcation without hemodynamically significant stenosis by NASCET criteria. Normal appearance of the included internal carotid artery. Left carotid system: Common carotid artery is widely patent, coursing in a straight line fashion. Normal appearance of the carotid bifurcation without hemodynamically significant stenosis by NASCET criteria. Normal appearance of the included internal carotid artery. Vertebral arteries:Left vertebral artery is dominant. Normal appearance of the vertebral arteries, which appear widely patent. Skeleton: No acute osseous process though bone windows have not been submitted. Moderate to severe C5-6 and C6-7 disc height loss, mild at C4-5 with uncovertebral hypertrophy/endplate spurring resulting in severe LEFT greater than RIGHT C5-6 neural foraminal narrowing. Other neck: Soft tissues of the neck are nonacute though, not tailored for evaluation. RIGHT upper lobe calcified granuloma. CTA HEAD Anterior circulation: Normal appearance of the cervical internal carotid arteries, petrous, cavernous and supra clinoid internal carotid arteries. Widely patent anterior communicating artery. Normal appearance of the anterior and middle cerebral arteries. Posterior circulation: Normal appearance of  the vertebral arteries, vertebrobasilar junction and basilar artery, as well as main branch vessels. Patent normal bilateral posterior cerebral arteries. Small RIGHT posterior communicating artery present. No large vessel occlusion, hemodynamically significant stenosis, dissection, luminal irregularity, contrast extravasation or aneurysm within the anterior nor posterior circulation. IMPRESSION: Negative CTA head and neck. Electronically Signed   By: Awilda Metro M.D.   On: 04/11/2015 23:28   Mr Shirlee Latch Wo Contrast  04/12/2015  CLINICAL DATA:  50 year old male with small acute right cerebellar PICA territory infarct following presentation of headache and dizziness. Initial encounter. EXAM: MRA HEAD WITHOUT CONTRAST TECHNIQUE: Angiographic images of the Circle of Willis were obtained using MRA technique without intravenous contrast. COMPARISON:  Brain MRI 0439 hours today. CTA head and neck 04/11/2015. FINDINGS: Antegrade flow in the posterior circulation with codominant distal vertebral arteries. No distal vertebral artery stenosis. Patent right PICA origin on series 3, image 32. Normal left PICA origin. Patent vertebrobasilar junction. No basilar artery stenosis. SCA and PCA origins are normal. Posterior communicating arteries are diminutive or absent. Bilateral PCA branches are normal. Antegrade flow in both ICA siphons. No siphon stenosis. Normal ophthalmic artery origins. Normal carotid termini, MCA and ACA origins. Diminutive anterior communicating artery. Visualized bilateral ACA branches are within normal limits. Bilateral MCA M1 segments, MCA bifurcations, and visualized bilateral MCA branches are within normal limits. No posterior fossa mass effect. IMPRESSION: Negative intracranial MRA.  Patent right PICA origin. Electronically Signed   By: Odessa Fleming M.D.   On: 04/12/2015 11:14   Mr Brain Wo Contrast  04/12/2015  CLINICAL DATA:  Dizziness and headache  after getting out of shower yesterday  evening. Vomiting. History of hypertension, pulmonary embolism, hyperlipidemia. EXAM: MRI HEAD WITHOUT CONTRAST TECHNIQUE: Multiplanar, multiecho pulse sequences of the brain and surrounding structures were obtained without intravenous contrast. COMPARISON:  CT head April 11, 2015 FINDINGS: The ventricles and sulci are normal for patient's age. No abnormal parenchymal signal, mass lesions, mass effect. Patchy reduced diffusion RIGHT mesial inferior cerebellum with low ADC values and FLAIR T2 hyperintense signal. No susceptibility artifact to suggest hemorrhage. No abnormal extra-axial fluid collections. No extra-axial masses though, contrast enhanced sequences would be more sensitive. Normal major intracranial vascular flow voids seen at the skull base. Ocular globes and orbital contents are unremarkable though not tailored for evaluation. No abnormal sellar expansion. No suspicious calvarial bone marrow signal. Craniocervical junction maintained. Mild paranasal sinus mucosal thickening. Trace RIGHT mastoid effusion. IMPRESSION: Acute small RIGHT inferior cerebellum (posterior-inferior cerebellar artery territory) infarct. Electronically Signed   By: Awilda Metroourtnay  Bloomer M.D.   On: 04/12/2015 05:24    Scheduled Meds: .  stroke: mapping our early stages of recovery book   Does not apply Once  . aspirin  300 mg Rectal Daily   Or  . aspirin  325 mg Oral Daily  . atorvastatin  80 mg Oral Daily  . carvedilol  3.125 mg Oral BID WC  . enoxaparin (LOVENOX) injection  40 mg Subcutaneous Q24H  . isosorbide mononitrate  30 mg Oral Daily  . multivitamin with minerals  1 tablet Oral Daily  . nicotine  21 mg Transdermal Daily  . potassium chloride  30 mEq Oral BID   Continuous Infusions:   Principal Problem:   Complicated migraine Active Problems:   Tobacco abuse   Essential hypertension   Hyperlipidemia with target LDL less than 70   CAD s/p MI and PCI of LAD(Xience 2.5 x 18 mm) in 2014   Headache    Dizziness   Cerebellar stroke (HCC)   Acute CVA (cerebrovascular accident) (HCC)   CVA (cerebral infarction)    Time spent: 20    Haydee SalterPhillip M Barney Gertsch  [facility] Pager [phone]. . If 7PM-7AM, please contact night-coverage at [email or web address], password [email or web address] 04/13/2015, 2:58 PM  LOS: 1 day
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
## 2015-04-13 NOTE — Progress Notes (Signed)
  Echocardiogram 2D Echocardiogram has been performed.  Carl Sanchez, Carl Sanchez 04/13/2015, 12:47 PM

## 2015-04-13 NOTE — Progress Notes (Signed)
Pt off the floor for procedure (echo)

## 2015-04-14 ENCOUNTER — Encounter: Payer: Self-pay | Admitting: Family Medicine

## 2015-04-14 LAB — RAPID URINE DRUG SCREEN, HOSP PERFORMED
AMPHETAMINES: NOT DETECTED
Barbiturates: NOT DETECTED
Benzodiazepines: NOT DETECTED
Cocaine: NOT DETECTED
Opiates: NOT DETECTED
Tetrahydrocannabinol: NOT DETECTED

## 2015-04-14 LAB — GLUCOSE, CAPILLARY: GLUCOSE-CAPILLARY: 109 mg/dL — AB (ref 65–99)

## 2015-04-14 LAB — HEMOGLOBIN A1C
HEMOGLOBIN A1C: 5.5 % (ref 4.8–5.6)
MEAN PLASMA GLUCOSE: 111 mg/dL

## 2015-04-14 MED ORDER — CLOPIDOGREL BISULFATE 75 MG PO TABS: 75.0000 mg | ORAL_TABLET | Freq: Every day | ORAL | Status: DC

## 2015-04-14 MED ORDER — NICOTINE 21 MG/24HR TD PT24: 21.0000 mg | MEDICATED_PATCH | Freq: Every day | TRANSDERMAL | Status: DC

## 2015-04-14 NOTE — Care Management Note (Signed)
Case Management Note  Patient Details  Name: Carl Sanchez MRN: [id number] Date of Birth: 08-Feb-1965  Subjective/Objective:                    Action/Plan: Patient discharging home with [facility] services. CM met with the patient and his fiance and provided them a list of [place] agencies in the [facility] area. They selected [place]. CM called and spoke with Tiffany with [facility] and she accepted the referral other than patient has no PCP. CM had provided the patient with the [facility] number and they are going to call before discharge and obtain a PCP. Tiffany to follow up after patient has a PCP. Bedside RN updated.   Expected Discharge Date:                  Expected Discharge Plan:  [place]  In-House Referral:     Discharge planning Services  CM Consult  Post Acute Care Choice:    Choice offered to:  Patient, Spouse  DME Arranged:    DME Agency:     HH Arranged:  PT [place]:  [facility]  Status of Service:  Completed, signed off  Medicare Important Message Given:    Date Medicare IM Given:    Medicare IM give by:    Date Additional Medicare IM Given:    Additional Medicare Important Message give by:     If discussed at [place] of Stay Meetings, dates discussed:    Additional Comments:  Pollie Friar, RN 04/14/2015, 12:34 PM
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
## 2015-04-14 NOTE — Discharge Instructions (Signed)
Stroke Prevention Some health problems and behaviors may make it more likely for you to have a stroke. Below are ways to lessen your risk of having a stroke.   Be active for at least 30 minutes on most or all days.  Do not smoke. Try not to be around others who smoke.  Do not drink too much alcohol.  Do not have more than 2 drinks a day if you are a man.  Do not have more than 1 drink a day if you are a woman and are not pregnant.  Eat healthy foods, such as fruits and vegetables. If you were put on a specific diet, follow the diet as told.  Keep your cholesterol levels under control through diet and medicines. Look for foods that are low in saturated fat, trans fat, cholesterol, and are high in fiber.  If you have diabetes, follow all diet plans and take your medicine as told.  Ask your doctor if you need treatment to lower your blood pressure. If you have high blood pressure (hypertension), follow all diet plans and take your medicine as told by your doctor.  If you are 2-52 years old, have your blood pressure checked every 3-5 years. If you are age 52 or older, have your blood pressure checked every year.  Keep a healthy weight. Eat foods that are low in calories, salt, saturated fat, trans fat, and cholesterol.  Do not take drugs.  Avoid birth control pills, if this applies. Talk to your doctor about the risks of taking birth control pills.  Talk to your doctor if you have sleep problems (sleep apnea).  Take all medicine as told by your doctor.  You may be told to take aspirin or blood thinner medicine. Take this medicine as told by your doctor.  Understand your medicine instructions.  Make sure any other conditions you have are being taken care of. GET HELP RIGHT AWAY IF:  You suddenly lose feeling (you feel numb) or have weakness in your face, arm, or leg.  Your face or eyelid hangs down to one side.  You suddenly feel confused.  You have trouble talking  (aphasia) or understanding what people are saying.  You suddenly have trouble seeing in one or both eyes.  You suddenly have trouble walking.  You are dizzy.  You lose your balance or your movements are clumsy (uncoordinated).  You suddenly have a very bad headache and you do not know the cause.  You have new chest pain.  Your heart feels like it is fluttering or skipping a beat (irregular heartbeat). Do not wait to see if the symptoms above go away. Get help right away. Call your local emergency services (911 in [place] Do not drive yourself to the hospital.   This information is not intended to replace advice given to you by your health care provider. Make sure you discuss any questions you have with your health care provider.   Document Released: 07/06/2011 Document Revised: 01/25/2014 Document Reviewed: 07/07/2012 Elsevier Interactive Patient Education 2016 [employer].  Ischemic Stroke Treated Without Warfarin An ischemic stroke (cerebrovascular accident) is the sudden death of brain tissue. It is a medical emergency. An ischemic stroke can cause permanent loss of brain function. This can cause problems with different parts of your body. CAUSES An ischemic stroke is caused by a decrease of oxygen supply to an area of your brain. It is usually the result of a small blood clot (embolus) or collection of cholesterol or  fat (plaque) that blocks blood flow in the brain. An ischemic stroke can also be caused by blocked or damaged carotid arteries. RISK FACTORS  High blood pressure (hypertension).  High cholesterol.  Diabetes mellitus.  Heart disease.  The buildup of plaque in the blood vessels (peripheral artery disease or atherosclerosis).  The buildup of plaque in the blood vessels that provide blood and oxygen to the brain (carotid artery stenosis).  An abnormal heart rhythm (atrial fibrillation).  Obesity.  Smoking cigarettes.  Taking oral contraceptives, especially  in combination with using tobacco.  Physical inactivity.  A diet that is high in fats, salt (sodium), and calories.  Excessive alcohol use.  Use of illegal drugs, especially cocaine and methamphetamine.  Being African American.  Being over the age of 52 years.  Family history of stroke.  Previous history of blood clots, stroke, TIA (transient ischemic attack), or heart attack.  Sickle cell disease. SIGNS AND SYMPTOMS These symptoms usually develop suddenly, or you may notice them after waking up from sleep. Symptoms may include sudden:  Weakness or numbness in your face, arm, or leg, especially on one side of your body.  Confusion.  Trouble speaking (aphasia) or understanding speech.  Trouble seeing with one or both eyes.  Trouble walking or difficulty moving your arms or legs.  Dizziness.  Loss of balance or coordination.  Severe headache with no known cause. The headache is often described as the worst headache ever experienced. DIAGNOSIS Your health care provider can often determine the presence or absence of an ischemic stroke based on your symptoms, history, and physical exam. CT (computed tomography) of the brain is usually performed to confirm the stroke, determine causes, and determine stroke severity. Other tests may be done to find the cause of the stroke. These tests may include:  ECG (electrocardiogram).  Continuous heart monitoring.  Echocardiogram.  Carotid ultrasound.  MRI.  A scan of the brain circulation.  Blood tests. TREATMENT It is very important to seek treatment at the first sign of stroke symptoms. Your health care provider may perform the following treatments within 6 hours of the onset of stroke symptoms:  Medicine to dissolve the blood clot (thrombolytic).  Inserting a device into the affected artery to remove the blood clot. These treatments may not be effective if too much time has passed since your stroke symptoms began. Even if  you do not know when your symptoms began, get treatment as soon as possible. There are other treatment options that may be given, such as:  Oxygen.  IV fluids.  Medicines to thin the blood (anticoagulants).  A procedure to widen blocked arteries. Your treatment will depend on how long you have had your symptoms, the severity of your symptoms, and the cause of your symptoms. Your health care provider will take measures to prevent short-term and long-term complications of stroke, such as:  Breathing foreign material into the lungs (aspiration pneumonia).  Blood clots in the legs.  Bedsores.  Falls. Medicines and dietary changes may be used to help treat and manage risk factors for stroke, such as diabetes and high blood pressure. If any of your body's functions were impaired by stroke, you may work with physical, speech, or occupational therapists to help you recover. HOME CARE INSTRUCTIONS  Take medicines only as directed by your health care provider. Follow the directions carefully. Medicines may be used to control risk factors for a stroke. Be sure that you understand all your medicine instructions.  If swallow studies have determined  that your swallowing reflex is present, you should eat healthy foods. Foods may need to be a soft or pureed consistency, or you may need to take small bites in order to avoid aspirating or choking.  Follow physical activity guidelines as directed by your health care team.  Do not use any tobacco products, including cigarettes, chewing tobacco, or electronic cigarettes. If you smoke, quit. If you need help quitting, ask your health care provider.  Limit or stop alcohol use.  A safe home environment is important to reduce the risk of falls. Your health care provider may arrange for specialists to evaluate your home. Having grab bars in the bedroom and bathroom is often important. Your health care provider may arrange for equipment to be used at home,  such as raised toilets and a seat for the shower.  Ongoing physical, occupational, and speech therapy may be needed to maximize your recovery after a stroke. If you have been advised to use a walker or a cane, use it at all times. Be sure to keep your therapy appointments.  Keep all follow-up visits with your health care provider. This is very important. This includes any referrals, therapy, rehabilitation, and lab tests. Proper follow-up can prevent another stroke from occurring. PREVENTION The risk of a stroke can be decreased by appropriately treating high blood pressure, high cholesterol, diabetes, heart disease, and obesity. It can also be decreased by quitting smoking, limiting alcohol, and staying physically active. SEEK IMMEDIATE MEDICAL CARE IF:  You have sudden weakness or numbness in your face, arm, or leg, especially on one side of your body.  You have sudden confusion.  You have sudden trouble speaking (aphasia) or understanding.  You have sudden trouble seeing with one or both eyes.  You have sudden trouble walking or difficulty moving your arms or legs.  You have sudden dizziness.  You have a sudden loss of balance or coordination.  You have a sudden, severe headache with no known cause.  You have a partial or total loss of consciousness. Any of these symptoms may represent a serious problem that is an emergency. Do not wait to see if the symptoms will go away. Get medical help right away. Call your local emergency services (911 in [place] Do not drive yourself to the hospital.   This information is not intended to replace advice given to you by your health care provider. Make sure you discuss any questions you have with your health care provider.   Document Released: 10/19/2013 Document Reviewed: 10/19/2013 Elsevier Interactive Patient Education [employer]

## 2015-04-14 NOTE — Progress Notes (Signed)
Patient ready for discharge to home; discharge instructions given and reviewed; Rx's given; patient discharged via wheelchair.

## 2015-04-14 NOTE — Evaluation (Addendum)
Occupational Therapy Evaluation AND Discharge  Patient Details Name: Carl Sanchez Wilbanks MRN: [id number] DOB: [birth year] Today's Date: 04/14/2015    History of Present Illness Carl Sanchez Breeze is a 50 y.o. male with a history of hypertension, coronary artery disease and myocardial infarction who was brought to the emergency room following acute onset of severe headache with associated vertigo and nausea. The symptoms reportedly were preceded by speech difficulty and a complaint of numbness involving the left side of his face. 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   Clinical Impression   Patient admitted with above. Patient independent PTA. Patient currently functioning at baseline, independently.  No additional OT needs identified, D/C from acute OT services and no additional follow-up OT needs at this time. All appropriate education provided to patient. Please re-order OT if needed.      Follow Up Recommendations  No OT follow up    Equipment Recommendations  None recommended by OT    Recommendations for Other Services  None at this time   Precautions / Restrictions Precautions Precautions: Fall Restrictions Weight Bearing Restrictions: No    Mobility Bed Mobility Overal bed mobility: Modified Independent General bed mobility comments: pt denies dizziness this date and didn't require focusing on non-moving object  Transfers Overall transfer level: Needs assistance Equipment used: None Transfers: Sit to/from Stand Sit to Stand: Modified independent (Device/Increase time) General transfer comment: pt denies dizziness, no instability    Balance Overall balance assessment: No apparent balance deficits (not formally assessed)    ADL Overall ADL's : Independent;At baseline    Vision Vision Assessment?: No apparent visual deficits Additional Comments: ROM WFL, peripheral WFL          Pertinent Vitals/Pain Pain Assessment: No/denies pain     Hand Dominance  Right   Extremity/Trunk Assessment Upper Extremity Assessment Upper Extremity Assessment: Overall WFL for tasks assessed   Lower Extremity Assessment Lower Extremity Assessment: Overall WFL for tasks assessed   Cervical / Trunk Assessment Cervical / Trunk Assessment: Normal   Communication Communication Communication: No difficulties   Cognition Arousal/Alertness: Awake/alert Behavior During Therapy: WFL for tasks assessed/performed Overall Cognitive Status: Within Functional Limits for tasks assessed              Home Living Family/patient expects to be discharged to:: Private residence Living Arrangements: Spouse/significant other Available Help at Discharge: Family;Available PRN/intermittently Type of Home: House Home Access: Level entry     Home Layout: Two level Alternate Level Stairs-Number of Steps: flight Alternate Level Stairs-Rails: Right Bathroom Shower/Tub: Chief Strategy OfficerTub/shower unit   Bathroom Toilet: Standard Additional Comments: 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      Prior Functioning/Environment Level of Independence: Independent  Comments: pt working PTA    OT Diagnosis: Generalized weakness   OT Problem List:   N/a, no acute OT needs identified at this time     OT Treatment/Interventions:   N/a, no acute OT needs identified at this time     OT Goals(Current goals can be found in the care plan section) Acute Rehab OT Goals Patient Stated Goal: go home OT Goal Formulation: All assessment and education complete, DC therapy  OT Frequency:   N/a, no acute OT needs identified at this time     Barriers to D/C:  none known at this time    End of Session Activity Tolerance: Patient tolerated treatment well Patient left:  (walking in room with fiance and doctor present, pt is independent in room)   Time:  1308-6578 OT Time Calculation (min): 9 min Charges:  OT General Charges $OT Visit: 1 Procedure OT Evaluation $OT Eval Low Complexity: 1  Procedure  Edwin Cap , MS, OTR/L, CLT Pager: [phone]  04/14/2015, 9:21 AM

## 2015-04-14 NOTE — Progress Notes (Signed)
Thank you for consult on [name]. Chart reviewed and note that has made great progress with improvement in vestibular symptoms. Outpatient vestibular rehab recommended if his symptoms do not improve by discharge. Will defer CIR consult for now.

## 2015-04-14 NOTE — Progress Notes (Signed)
STROKE TEAM PROGRESS NOTE   SUBJECTIVE (INTERVAL HISTORY) Wife at bedside. Pt felt back to baseline. Patient plans for discharge home today. 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 Recommend pt home rest 2-4 weeks before resuming work. Recommend gradually increase the work load.    OBJECTIVE Temp:  [98 F (36.7 C)-98.3 F (36.8 C)] 98 F (36.7 C) (03/27 1019) Pulse Rate:  [60-85] 64 (03/27 1019) Cardiac Rhythm:  [-]  Resp:  [16-20] 16 (03/27 1019) BP: (102-128)/(61-86) 128/86 mmHg (03/27 1019) SpO2:  [96 %-100 %] 98 % (03/27 1019)  CBC:   Recent Labs Lab 04/11/15 2150 04/11/15 2210 04/12/15 0620  WBC 11.5*  --  9.7  NEUTROABS 5.8  --   --   HGB 17.5* 18.7* 16.2  HCT 50.5 55.0* 48.6  MCV 85.6  --  87.3  PLT 223  --  180    Basic Metabolic Panel:   Recent Labs Lab 04/12/15 0620 04/13/15 0437  NA 141 142  K 4.5 3.6  CL 108 106  CO2 25 29  GLUCOSE 133* 100*  BUN 8 7  CREATININE 1.04 0.94  CALCIUM 9.0 8.8*    Lipid Panel:     Component Value Date/Time   CHOL 122 04/12/2015 0620   TRIG 53 04/12/2015 0620   HDL 38* 04/12/2015 0620   CHOLHDL 3.2 04/12/2015 0620   VLDL 11 04/12/2015 0620   LDLCALC 73 04/12/2015 0620   HgbA1c:  Lab Results  Component Value Date   HGBA1C 5.5 04/13/2015   Urine Drug Screen: No results found for: LABOPIA, COCAINSCRNUR, LABBENZ, AMPHETMU, THCU, LABBARB    IMAGING I have personally reviewed the radiological images below and agree with the radiology interpretations.  Ct Angio Head W/cm &/or Wo Cm 04/11/2015   Negative CTA head and neck.   Ct Head Wo Contrast 04/11/2015   No acute intracranial pathology.   Ct Angio Neck W/cm &/or Wo/cm 04/11/2015   Negative CTA head and neck.  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   2D echo - - Left ventricle: The cavity size was normal. Systolic function was  mildly  to moderately reduced. The estimated ejection fraction was  in the range of 40% to 45%. Doppler parameters are consistent  with abnormal left ventricular relaxation (grade 1 diastolic  dysfunction). - Regional wall motion abnormality: Severe hypokinesis of the  mid-apical anterior and mid anteroseptal myocardium; hypokinesis  of the apical septal and apical myocardium. - Aortic valve: Transvalvular velocity was within the normal range.  There was no stenosis. There was no regurgitation. - Mitral valve: There was no regurgitation. - Left atrium: The atrium was severely dilated. - Right ventricle: The cavity size was normal. Wall thickness was  normal. Systolic function was normal. - Atrial septum: No defect or patent foramen ovale was identified  by color flow Doppler. - Tricuspid valve: There was no regurgitation. - Inferior vena cava: The vessel was normal in size. The  respirophasic diameter changes were in the normal range (>= 50%),  consistent with normal central venous pressure. Impressions: - Findings consistent with prior LAD infarct.    PHYSICAL EXAM General - Well nourished, well developed, in NAD   Cardiovascular - Regular rate and rhythm Pulmonary: CTA Abdomen: NT, ND, normal bowel sounds Extremities: No C/C/E  Neurological Exam Mental Status: Normal Orientation:  Oriented to person, place and time Speech:  Fluent; no dysarthria Cranial Nerves:  PERRL; EOMI; visual fields full, face grossly symmetric, hearing grossly intact; shrug symmetric and tongue midline  Motor Exam:  Tone:  Within normal limits; Strength: 5/5 throughout  Sensory: Intact to light touch throughout  Coordination:  Intact finger to nose; heel-to shin normal  Gait: normal gait, turning   ASSESSMENT/PLAN [name] is a 50 y.o. male with history of Coronary artery disease with previous MI, ongoing tobacco use, hypertension, and history of pulmonary embolism  presenting  with acute onset of a severe headache associated with nausea, vertigo, speech difficulties, and left facial numbness. He did not receive IV t-PA due to partial resolution of deficits.  Stroke:  Small R cerebellar infarct likely secondary to small vessel disease.  Resultant  Resolution of deficit  MRI  Acute small RIGHT inferior cerebellum (posterior-inferior cerebellar artery territory) infarct.   CTA - negative CTA of head and neck  2D Echo  EF 40-45%. No cardiac source of emboli identified.   hypercoagulable panel pending   UDS negative   LDL 73  HgbA1c 5.5  VTE prophylaxis - Lovenox Diet Heart Room service appropriate?: Yes; Fluid consistency:: Thin  aspirin 81 mg daily prior to admission, now on aspirin 325 mg daily. As stroke on aspirin, change aspirin to plavix 75 mg daily  Patient counseled to be compliant with his antithrombotic medications  Ongoing aggressive stroke risk factor management  Therapy recommendations: Outpatient physical therapy recommended  Disposition: d/c home with OP therapies  Recommend that he not return to work for 2-4 weeks.   Follow-up in the office with Dr. Pearlean BrownieSethi in 2 months. Order in place  Hypertension  Blood pressure tends to run low ( On Coreg 3.125 mg twice daily and Imdur 30 mg daily )  Permissive hypertension (OK if < 220/120) but gradually normalize in 5-7 days  Hyperlipidemia  Home meds: Lipitor 80 mg daily  resumed in hospital  LDL73, goal < 70  Continue statin at discharge  Diabetes  HgbA1c 5.5, goal < 7.0  Uncontrolled  Other Stroke Risk Factors  Cigarette smoker, advised to stop smoking. Ok to resume Welbutrin  Coronary artery disease  Hospital day # 2   Neurology will sign off. Please call with questions. Pt [facility] months. Thanks for the consult.  Marvel PlanJindong Jamal Haskin, MD PhD Stroke Neurology 04/15/2015 12:01 AM    To contact Stroke Continuity provider, please refer to  [email or web address]. After hours, contact General Neurology

## 2015-04-14 NOTE — Progress Notes (Signed)
Physical Therapy Treatment Patient Details Name: Carl Sanchez MRN: [id number] DOB: 1965-05-11 Today's Date: 04/14/2015    History of Present Illness Carl Sanchez is a 50 y.o. male with a history of hypertension, coronary artery disease and myocardial infarction who was brought to the emergency room following acute onset of severe headache with associated vertigo and nausea. The symptoms reportedly were preceded by speech difficulty and a complaint of numbness involving the left side of his face. 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    PT Comments    Patient preparing for discharge home on PT arrival. See below re: overview of vestibular rehab exercises pt provided and demonstrated. Educated on progressing activity on his return home.  Noted CM arranged HHPT and discussed difference with pt. As quickly as he is resolving, feel a home safety evaluation may show that he is asymptomatic and no further PT (including OPPT) is indicated.    Follow Up Recommendations  Outpatient PT;Supervision - Intermittent     Equipment Recommendations  None recommended by PT    Recommendations for Other Services       Precautions / Restrictions Precautions Precautions: Fall    Mobility  Bed Mobility Overal bed mobility: Independent             General bed mobility comments: denies dizziness  Transfers Overall transfer level: Independent Equipment used: None Transfers: Sit to/from Stand Sit to Stand: Independent         General transfer comment: denies vertigo; no instability  Ambulation/Gait             General Gait Details: deferred; focus on HEP   Stairs            Wheelchair Mobility    Modified Rankin (Stroke Patients Only) Modified Rankin (Stroke Patients Only) Pre-Morbid Rankin Score: No symptoms Modified Rankin: No significant disability     Balance                                    Cognition Arousal/Alertness:  Awake/alert Behavior During Therapy: WFL for tasks assessed/performed Overall Cognitive Status: Within Functional Limits for tasks assessed                      Exercises Other Exercises Other Exercises: Patient reports he did VOR x1 exercises in sitting without difficulty or symptoms. When pt demonstrated, he was turning head very slowly and to endrange of his vision each direction. Educated on smaller ROM and incr speed. Pt tolerating and progressed to standing with feet apart. Pt performed horizontal head x1 exercises x 10 seconds and denied symptoms. Provided written instructions for advancing difficulty of x1 exercises and demonstrated the changes in positions.     General Comments        Pertinent Vitals/Pain Pain Assessment: No/denies pain    Home Living     Available Help at Discharge: Family;Available PRN/intermittently Type of Home: House              Prior Function            PT Goals (current goals can now be found in the care plan section) Acute Rehab PT Goals Patient Stated Goal: go home Time For Goal Achievement: 04/19/15 Progress towards PT goals: Progressing toward goals    Frequency  Min 4X/week    PT Plan Discharge plan needs to be updated    Co-evaluation  End of Session   Activity Tolerance: Patient tolerated treatment well Patient left: in chair;with call bell/phone within reach;with nursing/sitter in room;with family/visitor present (preparing to d/c home)     Time: 7829-5621 PT Time Calculation (min) (ACUTE ONLY): 13 min  Charges:  $Therapeutic Exercise: 8-22 mins                    G Codes:      Carl Sanchez 2015/04/19, 1:53 PM Pager [phone]

## 2015-04-15 ENCOUNTER — Ambulatory Visit: Payer: Self-pay | Admitting: Family Medicine

## 2015-04-15 ENCOUNTER — Ambulatory Visit (INDEPENDENT_AMBULATORY_CARE_PROVIDER_SITE_OTHER): Payer: BLUE CROSS/BLUE SHIELD | Admitting: Medical

## 2015-04-15 ENCOUNTER — Encounter: Payer: Self-pay | Admitting: Medical

## 2015-04-15 VITALS — BP 120/94 | HR 76 | Wt 204.0 lb

## 2015-04-15 DIAGNOSIS — I639 Cerebral infarction, unspecified: Secondary | ICD-10-CM

## 2015-04-15 DIAGNOSIS — R0602 Shortness of breath: Secondary | ICD-10-CM | POA: Diagnosis not present

## 2015-04-15 DIAGNOSIS — Z86711 Personal history of pulmonary embolism: Secondary | ICD-10-CM

## 2015-04-15 DIAGNOSIS — F17200 Nicotine dependence, unspecified, uncomplicated: Secondary | ICD-10-CM

## 2015-04-15 DIAGNOSIS — R9439 Abnormal result of other cardiovascular function study: Secondary | ICD-10-CM

## 2015-04-15 DIAGNOSIS — I10 Essential (primary) hypertension: Secondary | ICD-10-CM

## 2015-04-15 DIAGNOSIS — Z9861 Coronary angioplasty status: Secondary | ICD-10-CM | POA: Diagnosis not present

## 2015-04-15 DIAGNOSIS — E785 Hyperlipidemia, unspecified: Secondary | ICD-10-CM | POA: Diagnosis not present

## 2015-04-15 DIAGNOSIS — I2510 Atherosclerotic heart disease of native coronary artery without angina pectoris: Secondary | ICD-10-CM | POA: Diagnosis not present

## 2015-04-15 DIAGNOSIS — R931 Abnormal findings on diagnostic imaging of heart and coronary circulation: Secondary | ICD-10-CM | POA: Diagnosis not present

## 2015-04-15 DIAGNOSIS — Z720 Tobacco use: Secondary | ICD-10-CM | POA: Diagnosis not present

## 2015-04-15 LAB — PROTEIN S ACTIVITY: PROTEIN S ACTIVITY: 100 % (ref 63–140)

## 2015-04-15 LAB — CARDIOLIPIN ANTIBODIES, IGG, IGM, IGA
Anticardiolipin IgA: 9 APL U/mL (ref 0–11)
Anticardiolipin IgG: <9 GPL U/mL (ref 0–14)

## 2015-04-15 LAB — PROTEIN C ACTIVITY: Protein C Activity: 90 % (ref 73–180)

## 2015-04-15 LAB — LUPUS ANTICOAGULANT PANEL
DRVVT: 36.1 s (ref 0.0–44.0)
PTT Lupus Anticoagulant: 34.4 s (ref 0.0–43.6)

## 2015-04-15 LAB — HOMOCYSTEINE: HOMOCYSTEINE-NORM: 11.8 umol/L (ref 0.0–15.0)

## 2015-04-15 LAB — PROTEIN S, TOTAL: Protein S Ag, Total: 101 % (ref 60–150)

## 2015-04-15 LAB — PROTEIN C, TOTAL: Protein C, Total: 64 % (ref 60–150)

## 2015-04-15 NOTE — Patient Instructions (Signed)
Ophthalmology Dr. Howard McFarland [address], [place], [place] [address] [phone]   [facility] Dr. Timothy Koop [address] [place], [place] [address]  [phone] [email or web address]   Sigmund S. Gould, M.D. Jason A. Gould, O.D. [address] [place], [place] [address] Medical telephone: [phone] Optical telephone: [phone]  
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
## 2015-04-16 ENCOUNTER — Encounter: Payer: Self-pay | Admitting: Neurology

## 2015-04-16 ENCOUNTER — Ambulatory Visit (INDEPENDENT_AMBULATORY_CARE_PROVIDER_SITE_OTHER): Payer: BLUE CROSS/BLUE SHIELD | Admitting: Neurology

## 2015-04-16 ENCOUNTER — Encounter: Payer: Self-pay | Admitting: Medical

## 2015-04-16 ENCOUNTER — Ambulatory Visit (INDEPENDENT_AMBULATORY_CARE_PROVIDER_SITE_OTHER): Payer: BLUE CROSS/BLUE SHIELD | Admitting: Cardiovascular Disease

## 2015-04-16 ENCOUNTER — Other Ambulatory Visit: Payer: Self-pay | Admitting: *Deleted

## 2015-04-16 ENCOUNTER — Encounter: Payer: Self-pay | Admitting: Cardiovascular Disease

## 2015-04-16 VITALS — BP 110/76 | HR 82 | Ht 69.0 in | Wt 205.0 lb

## 2015-04-16 VITALS — BP 110/64 | HR 99 | Ht 69.0 in | Wt 204.0 lb

## 2015-04-16 DIAGNOSIS — I2583 Coronary atherosclerosis due to lipid rich plaque: Principal | ICD-10-CM

## 2015-04-16 DIAGNOSIS — I639 Cerebral infarction, unspecified: Secondary | ICD-10-CM | POA: Diagnosis not present

## 2015-04-16 DIAGNOSIS — E785 Hyperlipidemia, unspecified: Secondary | ICD-10-CM | POA: Diagnosis not present

## 2015-04-16 DIAGNOSIS — I2510 Atherosclerotic heart disease of native coronary artery without angina pectoris: Secondary | ICD-10-CM

## 2015-04-16 DIAGNOSIS — I10 Essential (primary) hypertension: Secondary | ICD-10-CM

## 2015-04-16 DIAGNOSIS — H538 Other visual disturbances: Secondary | ICD-10-CM

## 2015-04-16 DIAGNOSIS — Z86711 Personal history of pulmonary embolism: Secondary | ICD-10-CM | POA: Insufficient documentation

## 2015-04-16 DIAGNOSIS — I119 Hypertensive heart disease without heart failure: Secondary | ICD-10-CM

## 2015-04-16 DIAGNOSIS — Z720 Tobacco use: Secondary | ICD-10-CM

## 2015-04-16 DIAGNOSIS — F17200 Nicotine dependence, unspecified, uncomplicated: Secondary | ICD-10-CM

## 2015-04-16 LAB — BETA-2-GLYCOPROTEIN I ABS, IGG/M/A: Beta-2 Glyco I IgG: <9 GPI IgG units (ref 0–20)

## 2015-04-16 MED ORDER — CLOPIDOGREL BISULFATE 75 MG PO TABS: 75.0000 mg | ORAL_TABLET | Freq: Every day | ORAL | Status: DC

## 2015-04-16 NOTE — Patient Instructions (Signed)
1.  Continue Plavix 75mg  daily 2.  Continue Lipitor 80mg  daily.  Recheck fasting lipid panel in 3 months 3.  Blood pressure control 4.  Quit smoking 5.  Mediterranean diet    Why follow it? Research shows. . Those who follow the Mediterranean diet have a reduced risk of heart disease  . The diet is associated with a reduced incidence of Parkinson's and Alzheimer's diseases . People following the diet may have longer life expectancies and lower rates of chronic diseases  . The Dietary Guidelines for Americans recommends the Mediterranean diet as an eating plan to promote health and prevent disease  What Is the Mediterranean Diet?  . Healthy eating plan based on typical foods and recipes of Mediterranean-style cooking . The diet is primarily a plant based diet; these foods should make up a majority of meals   Starches - Plant based foods should make up a majority of meals - They are an important sources of vitamins, minerals, energy, antioxidants, and fiber - Choose whole grains, foods high in fiber and minimally processed items  - Typical grain sources include wheat, oats, barley, corn, brown rice, bulgar, farro, millet, polenta, couscous  - Various types of beans include chickpeas, lentils, fava beans, black beans, white beans   Fruits  Veggies - Large quantities of antioxidant rich fruits & veggies; 6 or more servings  - Vegetables can be eaten raw or lightly drizzled with oil and cooked  - Vegetables common to the traditional Mediterranean Diet include: artichokes, arugula, beets, broccoli, brussel sprouts, cabbage, carrots, celery, collard greens, cucumbers, eggplant, kale, leeks, lemons, lettuce, mushrooms, okra, onions, peas, peppers, potatoes, pumpkin, radishes, rutabaga, shallots, spinach, sweet potatoes, turnips, zucchini - Fruits common to the Mediterranean Diet include: apples, apricots, avocados, cherries, clementines, dates, figs, grapefruits, grapes, melons, nectarines, oranges,  peaches, pears, pomegranates, strawberries, tangerines  Fats - Replace butter and margarine with healthy oils, such as olive oil, canola oil, and tahini  - Limit nuts to no more than a handful a day  - Nuts include walnuts, almonds, pecans, pistachios, pine nuts  - Limit or avoid candied, honey roasted or heavily salted nuts - Olives are central to the [employer]iterranean diet - can be eaten whole or used in a variety of dishes   Meats Protein - Limiting red meat: no more than a few times a month - When eating red meat: choose lean cuts and keep the portion to the size of deck of cards - Eggs: approx. 0 to 4 times a week  - Fish and lean poultry: at least 2 a week  - Healthy protein sources include, chicken, [place], lean beef, lamb - Increase intake of seafood such as tuna, salmon, trout, mackerel, shrimp, scallops - Avoid or limit high fat processed meats such as sausage and bacon  Dairy - Include moderate amounts of low fat dairy products  - Focus on healthy dairy such as fat free yogurt, skim milk, low or reduced fat cheese - Limit dairy products higher in fat such as whole or 2% milk, cheese, ice cream  Alcohol - Moderate amounts of red wine is ok  - No more than 5 oz daily for women (all ages) and men older than age 52  - No more than 10 oz of wine daily for men younger than 7265  Other - Limit sweets and other desserts  - Use herbs and spices instead of salt to flavor foods  - Herbs and spices common to the traditional Mediterranean Diet include:  basil, bay leaves, chives, cloves, cumin, fennel, garlic, lavender, marjoram, mint, oregano, parsley, pepper, rosemary, sage, savory, sumac, tarragon, thyme   It's not just a diet, it's a lifestyle:  . The Mediterranean diet includes lifestyle factors typical of those in the region  . Foods, drinks and meals are best eaten with others and savored . Daily physical activity is important for overall good health . This could be strenuous exercise like  running and aerobics . This could also be more leisurely activities such as walking, housework, yard-work, or taking the stairs . Moderation is the key; a balanced and healthy diet accommodates most foods and drinks . Consider portion sizes and frequency of consumption of certain foods   Meal Ideas & Options:  . Breakfast:  o Whole wheat toast or whole wheat English muffins with peanut butter & hard boiled egg o Steel cut oats topped with apples & cinnamon and skim milk  o Fresh fruit: banana, strawberries, melon, berries, peaches  o Smoothies: strawberries, bananas, greek yogurt, peanut butter o Low fat greek yogurt with blueberries and granola  o Egg white omelet with spinach and mushrooms o Breakfast couscous: whole wheat couscous, apricots, skim milk, cranberries  . Sandwiches:  o Hummus and grilled vegetables (peppers, zucchini, squash) on whole wheat bread   o Grilled chicken on whole wheat pita with lettuce, tomatoes, cucumbers or tzatziki  o Tuna salad on whole wheat bread: tuna salad made with greek yogurt, olives, red peppers, capers, green onions o Garlic rosemary lamb pita: lamb sauted with garlic, rosemary, salt & pepper; add lettuce, cucumber, greek yogurt to pita - flavor with lemon juice and black pepper  . Seafood:  o Mediterranean grilled salmon, seasoned with garlic, basil, parsley, lemon juice and black pepper o Shrimp, lemon, and spinach whole-grain pasta salad made with low fat greek yogurt  o Seared scallops with lemon orzo  o Seared tuna steaks seasoned salt, pepper, coriander topped with tomato mixture of olives, tomatoes, olive oil, minced garlic, parsley, green onions and cappers  . Meats:  o Herbed greek chicken salad with kalamata olives, cucumber, feta  o Red bell peppers stuffed with spinach, bulgur, lean ground beef (or lentils) & topped with feta   o Kebabs: skewers of chicken, tomatoes, onions, zucchini, squash  o [place] burgers: made with red onions,  mint, dill, lemon juice, feta cheese topped with roasted red peppers . Vegetarian o Cucumber salad: cucumbers, artichoke hearts, celery, red onion, feta cheese, tossed in olive oil & lemon juice  o Hummus and whole grain pita points with a greek salad (lettuce, tomato, feta, olives, cucumbers, red onion) o Lentil soup with celery, carrots made with vegetable broth, garlic, salt and pepper  o Tabouli salad: parsley, bulgur, mint, scallions, cucumbers, tomato, radishes, lemon juice, olive oil, salt and pepper. 6.  Refer to ophthalmology 7.  Follow up in approximately 3 months.

## 2015-04-16 NOTE — Patient Instructions (Signed)
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
## 2015-04-16 NOTE — Telephone Encounter (Signed)
Rx(s) sent to pharmacy electronically.  
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
## 2015-04-17 ENCOUNTER — Telehealth: Payer: Self-pay | Admitting: Cardiovascular Disease

## 2015-04-17 ENCOUNTER — Telehealth: Payer: Self-pay | Admitting: Neurology

## 2015-04-17 NOTE — Telephone Encounter (Signed)
OK to return to work from a cardiac standpoint.  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

## 2015-04-17 NOTE — Telephone Encounter (Signed)
Advised patient and will send to Dr Lenox Pondsysiner  Patient stated he has seen his eye doctor

## 2015-04-17 NOTE — Telephone Encounter (Signed)
Yes.  We can send letter stating that from a neurologic standpoint, he may return to work.

## 2015-04-17 NOTE — Telephone Encounter (Signed)
Carl Sanchez 11-30-2065 called requesting Dr. Everlena CooperJaffe to fax a letter to his PCP for work. His contact number is [phone]. Thank you

## 2015-04-17 NOTE — Telephone Encounter (Signed)
Pt is requesting a letter of clearance go back to work faxed to his PCP.  Dr. Aleen Campiysinger  @ [phone]. Please advise.

## 2015-04-17 NOTE — Telephone Encounter (Signed)
Pt says he need a letter stating that he is okay to go back to work. Please fax to his primary doctor,Dr Onalee Huaavid [phone].

## 2015-04-17 NOTE — Telephone Encounter (Signed)
Pt aware. Letter sent.

## 2015-04-18 ENCOUNTER — Encounter: Payer: Self-pay | Admitting: Cardiovascular Disease

## 2015-04-18 ENCOUNTER — Telehealth: Payer: Self-pay | Admitting: Medical

## 2015-04-18 ENCOUNTER — Telehealth: Payer: Self-pay

## 2015-04-18 LAB — PROTHROMBIN GENE MUTATION

## 2015-04-18 LAB — FACTOR 5 LEIDEN

## 2015-04-18 NOTE — Telephone Encounter (Signed)
Pt called stating he wants to return to work Monday and we are holding him up. States he has been "jumping through hoops" for us to get the two letters we asked for. He said we must have a glitch if we havent gotten them yet and that is not his problem. Stated he wants his clearence by the end of the day so he can go to work on Monday. I informed him that all I can do is relay this to [place]ane I can not give him a letter of clearence it must come from [place]. Pt continued to go on an on about needing to return to work and he needed it today. I told him I will talk with Vincenza HewsShane and will call him when I have infromation for him which is why I did not return his message from earlier today. I see no reason to call the patient until I have an answer for his questions and I informed him of that. Vincenza HewsShane mentioned he also needs a note from pts eye doctor as well so I will look through his records and see if we have that or not.

## 2015-04-18 NOTE — Telephone Encounter (Signed)
Please call eye doctor Dr. Philis Kendallhonda Thurmond who saw him 04/17/15.   I received office note.  I assume they cleared him to go back to work too, but not sure.  It doesn't specify in the note.  Please call and inquire.

## 2015-04-21 ENCOUNTER — Other Ambulatory Visit: Payer: Self-pay | Admitting: Medical

## 2015-04-21 ENCOUNTER — Encounter: Payer: Self-pay | Admitting: Family Medicine

## 2015-04-21 ENCOUNTER — Institutional Professional Consult (permissible substitution): Payer: Self-pay | Admitting: Medical

## 2015-04-21 ENCOUNTER — Encounter: Payer: Self-pay | Admitting: Medical

## 2015-04-21 NOTE — Telephone Encounter (Signed)
Pt has been dismissed

## 2015-04-21 NOTE — Telephone Encounter (Signed)
Pt just called and stated that he needs letter now, and that we are costing him a lot of money and he is starting to get annoyed "which isnt a good thing" he states. I told him that Vincenza HewsShane was seeing pts and that I talked to his eye doctor so no [place] has all he needs and will only be able to work on it in between pts so I can not give him an exact time. His wife was yelling in the background "do I need to come stay in that office all day because I will" and he said the same thing after her I told them that will not speed up anything because Vincenza HewsShane still can only work on this inbetween pts. He said well you need to get on this now because I am getting annoyed and that's not a good thing. I told him I will let Vincenza HewsShane know you need it as soon as possible but like I said he is seeing pts right now. I told him Id call him back with an update when I had one. This pt has called me multiple times a day every day since his appt and every time I try to explain to him the same things, we needed the records and Vincenza HewsShane has to work on everything when he is not seeing pts.

## 2015-04-21 NOTE — Telephone Encounter (Signed)
I called and spoke to Mr. Thomley personally.   Of note, we just saw him as a new patient s/p recent stroke last week.   Over the last few days were able to secure him a neurology appt literally the same week we saw him which is quite unusual.   He had formerly been scheduled 2 months out from his hospitalization.  He didn't seem to acknowledge that fact or show appreciation that we worked so hard on his behalf.  He also saw cardiology Friday and eye doctor Friday.   We only received the eye doctor notes and info this morning.  I had already reviewed neurology and cardiology notes Thursday and Friday.    He was pushy on the phone both Thursday and Friday last week about getting clearance to work.  This morning he was almost threatening on the phone to my CMA.    I finished his letter of clearance this morning.  I called to talk to him about reviewing the cardiology, neurology and eye doctor notes, advised that his letter of clearance was ready.   I advised that we are glad his stroke wasn't worse, but I advised that we will not tolerate someone being pushy, making threats or being disrespectful to us.   I advised that we try to work on patient's behalf, we are here for our patients, we treat people with respect, and we actually worked quickly to get him back to work this soon.     I also advised that the [facility] own website recommends a year waiting period before returning to work after a stroke.   Thus, he is lucky that neurology and cardiology approved him to go back at this time.  I reiterated the fact that he is at higher risk of stroke the first month after having a stroke.   I advised that we were wiling to see him as a patient only if he treats us with respect, but we will not tolerate the behavior we have seen thus far.   He then proceeded to tell me I was fired and he was looking for a new doctor.   I advised that is probably best.   After talking with Lafonda Mossesiana our office manager, I advised of the  telephone encounter, and we will send him a letter of dismissal.

## 2015-04-21 NOTE — Telephone Encounter (Signed)
[facility] that the only thing noted was to return in a year for a routine eye exam they where not aware of him needing any clearance. She put me on hold the doctor stated yes he is cleared to go back.

## 2015-04-22 ENCOUNTER — Ambulatory Visit: Payer: BLUE CROSS/BLUE SHIELD | Admitting: Cardiovascular Disease
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
## 2015-05-09 ENCOUNTER — Encounter: Payer: Self-pay | Admitting: Family Medicine

## 2015-05-09 ENCOUNTER — Ambulatory Visit (INDEPENDENT_AMBULATORY_CARE_PROVIDER_SITE_OTHER): Payer: BLUE CROSS/BLUE SHIELD | Admitting: Family Medicine

## 2015-05-09 VITALS — BP 120/80 | HR 83 | Temp 98.4°F | Resp 12 | Ht 69.0 in | Wt 208.4 lb

## 2015-05-09 DIAGNOSIS — M542 Cervicalgia: Secondary | ICD-10-CM

## 2015-05-09 DIAGNOSIS — Z720 Tobacco use: Secondary | ICD-10-CM | POA: Diagnosis not present

## 2015-05-09 DIAGNOSIS — I10 Essential (primary) hypertension: Secondary | ICD-10-CM | POA: Diagnosis not present

## 2015-05-09 MED ORDER — CYCLOBENZAPRINE HCL 10 MG PO TABS: 10.0000 mg | ORAL_TABLET | Freq: Three times a day (TID) | ORAL | Status: DC | PRN

## 2015-05-09 NOTE — Patient Instructions (Addendum)
A few things to remember from today's visit:   1. Essential hypertension   2. Tobacco abuse Nicotine 21 mg x 6 weeks. If you quit smoking it will decrease the risk of having another event by 2000%  3. Cervicalgia Flexeril can cause drowsiness, so be caution, do not take if driving. Chiropractor appt recommended. Massage and OTC rubbing medications might also help.        If you sign-up for My chart, you can communicate easier with us in case you have any question or concern.
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
## 2015-05-09 NOTE — Progress Notes (Signed)
Pre visit review using our clinic review tool, if applicable. No additional management support is needed unless otherwise documented below in the visit note. 

## 2015-05-20 ENCOUNTER — Ambulatory Visit: Payer: BLUE CROSS/BLUE SHIELD | Admitting: Neurology

## 2015-08-01 ENCOUNTER — Ambulatory Visit: Payer: BLUE CROSS/BLUE SHIELD | Admitting: Neurology

## 2015-08-01 DIAGNOSIS — Z029 Encounter for administrative examinations, unspecified: Secondary | ICD-10-CM

## 2015-09-01 ENCOUNTER — Other Ambulatory Visit: Payer: Self-pay | Admitting: Family Medicine

## 2015-09-01 DIAGNOSIS — M542 Cervicalgia: Secondary | ICD-10-CM

## 2015-09-15 ENCOUNTER — Observation Stay (HOSPITAL_COMMUNITY)
Admission: EM | Admit: 2015-09-15 | Discharge: 2015-09-16 | Disposition: A | Discharge status: Home / Self Care | Payer: BLUE CROSS/BLUE SHIELD | Attending: Internal Medicine | Admitting: Internal Medicine

## 2015-09-15 ENCOUNTER — Emergency Department (HOSPITAL_COMMUNITY): Payer: BLUE CROSS/BLUE SHIELD

## 2015-09-15 ENCOUNTER — Encounter (HOSPITAL_COMMUNITY): Payer: Self-pay | Admitting: Emergency Medicine

## 2015-09-15 DIAGNOSIS — R0789 Other chest pain: Principal | ICD-10-CM | POA: Insufficient documentation

## 2015-09-15 DIAGNOSIS — G8929 Other chronic pain: Secondary | ICD-10-CM | POA: Insufficient documentation

## 2015-09-15 DIAGNOSIS — I5042 Chronic combined systolic (congestive) and diastolic (congestive) heart failure: Secondary | ICD-10-CM | POA: Insufficient documentation

## 2015-09-15 DIAGNOSIS — I2510 Atherosclerotic heart disease of native coronary artery without angina pectoris: Secondary | ICD-10-CM | POA: Insufficient documentation

## 2015-09-15 DIAGNOSIS — Z79899 Other long term (current) drug therapy: Secondary | ICD-10-CM | POA: Insufficient documentation

## 2015-09-15 DIAGNOSIS — R079 Chest pain, unspecified: Secondary | ICD-10-CM | POA: Diagnosis present

## 2015-09-15 DIAGNOSIS — Z8673 Personal history of transient ischemic attack (TIA), and cerebral infarction without residual deficits: Secondary | ICD-10-CM | POA: Diagnosis not present

## 2015-09-15 DIAGNOSIS — I5022 Chronic systolic (congestive) heart failure: Secondary | ICD-10-CM | POA: Diagnosis not present

## 2015-09-15 DIAGNOSIS — I10 Essential (primary) hypertension: Secondary | ICD-10-CM | POA: Diagnosis not present

## 2015-09-15 DIAGNOSIS — F17210 Nicotine dependence, cigarettes, uncomplicated: Secondary | ICD-10-CM | POA: Insufficient documentation

## 2015-09-15 DIAGNOSIS — I509 Heart failure, unspecified: Secondary | ICD-10-CM

## 2015-09-15 DIAGNOSIS — Z86711 Personal history of pulmonary embolism: Secondary | ICD-10-CM | POA: Diagnosis not present

## 2015-09-15 DIAGNOSIS — I252 Old myocardial infarction: Secondary | ICD-10-CM | POA: Insufficient documentation

## 2015-09-15 DIAGNOSIS — Z7902 Long term (current) use of antithrombotics/antiplatelets: Secondary | ICD-10-CM | POA: Insufficient documentation

## 2015-09-15 DIAGNOSIS — I255 Ischemic cardiomyopathy: Secondary | ICD-10-CM | POA: Insufficient documentation

## 2015-09-15 DIAGNOSIS — Z9861 Coronary angioplasty status: Secondary | ICD-10-CM | POA: Insufficient documentation

## 2015-09-15 DIAGNOSIS — I4891 Unspecified atrial fibrillation: Secondary | ICD-10-CM | POA: Diagnosis not present

## 2015-09-15 DIAGNOSIS — I110 Hypertensive heart disease with heart failure: Secondary | ICD-10-CM | POA: Diagnosis not present

## 2015-09-15 DIAGNOSIS — E785 Hyperlipidemia, unspecified: Secondary | ICD-10-CM | POA: Diagnosis present

## 2015-09-15 DIAGNOSIS — I639 Cerebral infarction, unspecified: Secondary | ICD-10-CM | POA: Diagnosis present

## 2015-09-15 DIAGNOSIS — Z720 Tobacco use: Secondary | ICD-10-CM | POA: Diagnosis present

## 2015-09-15 DIAGNOSIS — Z8249 Family history of ischemic heart disease and other diseases of the circulatory system: Secondary | ICD-10-CM | POA: Diagnosis not present

## 2015-09-15 HISTORY — DX: Tobacco use: Z72.0

## 2015-09-15 HISTORY — DX: Heart failure, unspecified: I50.9

## 2015-09-15 HISTORY — DX: Cerebral infarction, unspecified: I63.9

## 2015-09-15 LAB — TROPONIN I: Troponin I: <0.03 ng/mL (ref <0.03)

## 2015-09-15 LAB — LIPID PANEL
CHOLESTEROL: 118 mg/dL (ref 0–200)
HDL: 33 mg/dL — ABNORMAL LOW (ref >40)
LDL Cholesterol: 66 mg/dL (ref 0–99)
Total CHOL/HDL Ratio: 3.6 RATIO
Triglycerides: 93 mg/dL (ref <150)
VLDL: 19 mg/dL (ref 0–40)

## 2015-09-15 LAB — CBC
HEMATOCRIT: 48.8 % (ref 39.0–52.0)
HEMOGLOBIN: 16.5 g/dL (ref 13.0–17.0)
MCH: 30 pg (ref 26.0–34.0)
MCHC: 33.8 g/dL (ref 30.0–36.0)
MCV: 88.7 fL (ref 78.0–100.0)
Platelets: 192 10*3/uL (ref 150–400)
RBC: 5.5 MIL/uL (ref 4.22–5.81)
RDW: 12.4 % (ref 11.5–15.5)
WBC: 7.9 10*3/uL (ref 4.0–10.5)

## 2015-09-15 LAB — I-STAT TROPONIN, ED
TROPONIN I, POC: 0 ng/mL (ref 0.00–0.08)
Troponin i, poc: 0 ng/mL (ref 0.00–0.08)

## 2015-09-15 LAB — BASIC METABOLIC PANEL
ANION GAP: 8 (ref 5–15)
BUN: 18 mg/dL (ref 6–20)
CHLORIDE: 105 mmol/L (ref 101–111)
CO2: 24 mmol/L (ref 22–32)
Calcium: 9.8 mg/dL (ref 8.9–10.3)
Creatinine, Ser: 1.06 mg/dL (ref 0.61–1.24)
GFR calc Af Amer: >60 mL/min (ref >60)
GLUCOSE: 112 mg/dL — AB (ref 65–99)
POTASSIUM: 3.6 mmol/L (ref 3.5–5.1)
SODIUM: 137 mmol/L (ref 135–145)

## 2015-09-15 MED ORDER — ONDANSETRON HCL 4 MG/2ML IJ SOLN: 4.0000 mg | Freq: Four times a day (QID) | INTRAMUSCULAR | Status: DC | PRN

## 2015-09-15 MED ORDER — MORPHINE SULFATE (PF) 2 MG/ML IV SOLN: 2.0000 mg | INTRAVENOUS | Status: DC | PRN

## 2015-09-15 MED ORDER — GI COCKTAIL ~~LOC~~
30.0000 mL | Freq: Three times a day (TID) | ORAL | Status: DC | PRN
  Administered 2015-09-15 (×2): 30 mL via ORAL
  Filled 2015-09-15 (×2): qty 30

## 2015-09-15 MED ORDER — ASPIRIN 81 MG PO CHEW
324.0000 mg | CHEWABLE_TABLET | Freq: Once | ORAL | Status: AC
  Administered 2015-09-15: 324 mg via ORAL
  Filled 2015-09-15: qty 4

## 2015-09-15 MED ORDER — ASPIRIN EC 81 MG PO TBEC
81.0000 mg | DELAYED_RELEASE_TABLET | Freq: Every day | ORAL | Status: DC
  Filled 2015-09-15: qty 1

## 2015-09-15 MED ORDER — CARVEDILOL 3.125 MG PO TABS
3.1250 mg | ORAL_TABLET | Freq: Two times a day (BID) | ORAL | Status: DC
  Administered 2015-09-15 – 2015-09-16 (×4): 3.125 mg via ORAL
  Filled 2015-09-15 (×4): qty 1

## 2015-09-15 MED ORDER — NITROGLYCERIN 0.4 MG SL SUBL: 0.4000 mg | SUBLINGUAL_TABLET | SUBLINGUAL | Status: DC | PRN

## 2015-09-15 MED ORDER — ASPIRIN 81 MG PO CHEW: 324.0000 mg | CHEWABLE_TABLET | Freq: Every day | ORAL | Status: DC

## 2015-09-15 MED ORDER — ACETAMINOPHEN 325 MG PO TABS
650.0000 mg | ORAL_TABLET | ORAL | Status: DC | PRN
  Administered 2015-09-15: 650 mg via ORAL
  Filled 2015-09-15: qty 2

## 2015-09-15 MED ORDER — ATORVASTATIN CALCIUM 80 MG PO TABS
80.0000 mg | ORAL_TABLET | Freq: Every day | ORAL | Status: DC
  Administered 2015-09-15 – 2015-09-16 (×2): 80 mg via ORAL
  Filled 2015-09-15 (×4): qty 1

## 2015-09-15 MED ORDER — ISOSORBIDE MONONITRATE ER 30 MG PO TB24
30.0000 mg | ORAL_TABLET | Freq: Every day | ORAL | Status: DC
  Administered 2015-09-15 – 2015-09-16 (×2): 30 mg via ORAL
  Filled 2015-09-15 (×5): qty 1

## 2015-09-15 MED ORDER — LISINOPRIL 5 MG PO TABS
5.0000 mg | ORAL_TABLET | Freq: Every day | ORAL | Status: DC
  Administered 2015-09-15 – 2015-09-16 (×2): 5 mg via ORAL
  Filled 2015-09-15 (×2): qty 1

## 2015-09-15 MED ORDER — CLOPIDOGREL BISULFATE 75 MG PO TABS
75.0000 mg | ORAL_TABLET | Freq: Every day | ORAL | Status: DC
  Administered 2015-09-15 – 2015-09-16 (×2): 75 mg via ORAL
  Filled 2015-09-15 (×2): qty 1

## 2015-09-15 MED ORDER — GI COCKTAIL ~~LOC~~
30.0000 mL | Freq: Once | ORAL | Status: AC
  Administered 2015-09-15: 30 mL via ORAL
  Filled 2015-09-15: qty 30

## 2015-09-15 NOTE — ED Notes (Signed)
Attempted report x1. 

## 2015-09-15 NOTE — ED Notes (Signed)
2 pillows provided
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
## 2015-09-15 NOTE — ED Notes (Signed)
Admitting hospitalist at bedside

## 2015-09-15 NOTE — ED Triage Notes (Signed)
Pt presents to ER today from home with wife for sudden onset of CP/ SOB that woke pt up from sleep; pt states he has a hx of heartattack and stroke; pt also states he woke up with his hands and arms nnumb; pt states he took some tums without relief; pt tearful during triage

## 2015-09-15 NOTE — ED Notes (Signed)
Patient transported to X-ray 
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
## 2015-09-15 NOTE — ED Notes (Signed)
Mackuen MD at bedside 

## 2015-09-15 NOTE — ED Notes (Signed)
Attempted report x 2 
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
## 2015-09-16 ENCOUNTER — Observation Stay (HOSPITAL_BASED_OUTPATIENT_CLINIC_OR_DEPARTMENT_OTHER): Payer: BLUE CROSS/BLUE SHIELD

## 2015-09-16 DIAGNOSIS — I509 Heart failure, unspecified: Secondary | ICD-10-CM

## 2015-09-16 DIAGNOSIS — I252 Old myocardial infarction: Secondary | ICD-10-CM | POA: Diagnosis not present

## 2015-09-16 DIAGNOSIS — R079 Chest pain, unspecified: Secondary | ICD-10-CM

## 2015-09-16 DIAGNOSIS — I10 Essential (primary) hypertension: Secondary | ICD-10-CM

## 2015-09-16 DIAGNOSIS — Z720 Tobacco use: Secondary | ICD-10-CM

## 2015-09-16 DIAGNOSIS — E785 Hyperlipidemia, unspecified: Secondary | ICD-10-CM | POA: Diagnosis not present

## 2015-09-16 DIAGNOSIS — I5022 Chronic systolic (congestive) heart failure: Secondary | ICD-10-CM

## 2015-09-16 DIAGNOSIS — R0789 Other chest pain: Secondary | ICD-10-CM

## 2015-09-16 DIAGNOSIS — I2510 Atherosclerotic heart disease of native coronary artery without angina pectoris: Secondary | ICD-10-CM

## 2015-09-16 DIAGNOSIS — Z9861 Coronary angioplasty status: Secondary | ICD-10-CM

## 2015-09-16 LAB — ECHOCARDIOGRAM COMPLETE
Height: 69 in
Weight: 3337.6 oz

## 2015-09-16 MED ORDER — PERFLUTREN LIPID MICROSPHERE
1.0000 mL | INTRAVENOUS | Status: AC | PRN
  Administered 2015-09-16: 2 mL via INTRAVENOUS
  Filled 2015-09-16: qty 10

## 2015-09-16 MED ORDER — PERFLUTREN LIPID MICROSPHERE
INTRAVENOUS | Status: AC
  Administered 2015-09-16: 2 mL via INTRAVENOUS
  Filled 2015-09-16: qty 10

## 2015-09-16 MED ORDER — PANTOPRAZOLE SODIUM 40 MG PO TBEC
40.0000 mg | DELAYED_RELEASE_TABLET | Freq: Every day | ORAL | Status: DC
  Administered 2015-09-16: 40 mg via ORAL
  Filled 2015-09-16: qty 1

## 2015-09-16 MED ORDER — PANTOPRAZOLE SODIUM 40 MG PO TBEC: 40.0000 mg | DELAYED_RELEASE_TABLET | Freq: Every day | ORAL | 0 refills | Status: DC

## 2015-09-16 NOTE — Progress Notes (Signed)
Order received to discharge patient.  Patient expresses readiness to discharge.  Discharge instructions, follow up, medications and instructions for their use were discussed with patient and patient expressed understanding.  Telemetry monitor was removed and CCMD notified.

## 2015-09-16 NOTE — Progress Notes (Signed)
  Echocardiogram 2D Echocardiogram with definity has been performed.  Leta JunglingCooper, Jaslynne Dahan M 09/16/2015, 12:50 PM
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
## 2015-09-19 ENCOUNTER — Other Ambulatory Visit: Payer: Self-pay | Admitting: Family Medicine

## 2015-09-26 ENCOUNTER — Encounter: Payer: Self-pay | Admitting: Cardiovascular Disease

## 2015-09-26 ENCOUNTER — Ambulatory Visit (INDEPENDENT_AMBULATORY_CARE_PROVIDER_SITE_OTHER): Payer: BLUE CROSS/BLUE SHIELD | Admitting: Cardiovascular Disease

## 2015-09-26 VITALS — BP 130/86 | HR 82 | Ht 69.0 in | Wt 209.4 lb

## 2015-09-26 DIAGNOSIS — I110 Hypertensive heart disease with heart failure: Secondary | ICD-10-CM | POA: Diagnosis not present

## 2015-09-26 DIAGNOSIS — E785 Hyperlipidemia, unspecified: Secondary | ICD-10-CM

## 2015-09-26 DIAGNOSIS — I2583 Coronary atherosclerosis due to lipid rich plaque: Secondary | ICD-10-CM

## 2015-09-26 DIAGNOSIS — I2510 Atherosclerotic heart disease of native coronary artery without angina pectoris: Secondary | ICD-10-CM

## 2015-09-26 DIAGNOSIS — E669 Obesity, unspecified: Secondary | ICD-10-CM | POA: Diagnosis not present

## 2015-09-26 DIAGNOSIS — Z716 Tobacco abuse counseling: Secondary | ICD-10-CM

## 2015-09-26 DIAGNOSIS — I5042 Chronic combined systolic (congestive) and diastolic (congestive) heart failure: Secondary | ICD-10-CM

## 2015-09-26 MED ORDER — CLOPIDOGREL BISULFATE 75 MG PO TABS: 75.0000 mg | ORAL_TABLET | Freq: Every day | ORAL | 3 refills | Status: DC

## 2015-09-26 MED ORDER — CYCLOBENZAPRINE HCL 10 MG PO TABS: ORAL_TABLET | ORAL | 0 refills | Status: AC

## 2015-09-26 MED ORDER — PANTOPRAZOLE SODIUM 40 MG PO TBEC: 40.0000 mg | DELAYED_RELEASE_TABLET | Freq: Every day | ORAL | 3 refills | Status: DC

## 2015-09-26 NOTE — Patient Instructions (Signed)
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
## 2015-10-03 ENCOUNTER — Other Ambulatory Visit: Payer: Self-pay

## 2015-10-03 MED ORDER — ISOSORBIDE MONONITRATE ER 30 MG PO TB24: 30.0000 mg | ORAL_TABLET | Freq: Every day | ORAL | 1 refills | Status: DC

## 2015-10-17 ENCOUNTER — Ambulatory Visit: Payer: BLUE CROSS/BLUE SHIELD | Admitting: Cardiovascular Disease

## 2015-12-23 ENCOUNTER — Telehealth: Payer: Self-pay | Admitting: Cardiovascular Disease

## 2015-12-23 MED ORDER — LISINOPRIL 5 MG PO TABS: 5.0000 mg | ORAL_TABLET | Freq: Every day | ORAL | 3 refills | Status: DC

## 2015-12-23 MED ORDER — PANTOPRAZOLE SODIUM 40 MG PO TBEC: 40.0000 mg | DELAYED_RELEASE_TABLET | Freq: Every day | ORAL | 3 refills | Status: DC

## 2015-12-23 MED ORDER — ISOSORBIDE MONONITRATE ER 30 MG PO TB24: 30.0000 mg | ORAL_TABLET | Freq: Every day | ORAL | 3 refills | Status: DC

## 2015-12-23 MED ORDER — CLOPIDOGREL BISULFATE 75 MG PO TABS: 75.0000 mg | ORAL_TABLET | Freq: Every day | ORAL | 3 refills | Status: DC

## 2015-12-23 MED ORDER — CARVEDILOL 3.125 MG PO TABS: 3.1250 mg | ORAL_TABLET | Freq: Two times a day (BID) | ORAL | 3 refills | Status: DC

## 2015-12-23 MED ORDER — ATORVASTATIN CALCIUM 80 MG PO TABS: 80.0000 mg | ORAL_TABLET | Freq: Every day | ORAL | 3 refills | Status: DC

## 2015-12-23 NOTE — Telephone Encounter (Signed)
Poke with pt wife and she stated pt insurance was change and they are no longer with [facility],   Rx has been sent to the pharmacy electronically.

## 2015-12-23 NOTE — Telephone Encounter (Signed)
New message   Pt wife called to have the prescriptions changed to the local pharmacy    *STAT* If patient is at the pharmacy, call can be transferred to refill team.   1. Which medications need to be refilled? (please list name of each medication and dose if known) pt wife said every medication in his file; too many to mention  2. Which pharmacy/location (including street and city if local pharmacy) is medication to be sent to? [facility] [address] [place] [phone]/ [address] [place] [phone]  3. Do they need a 30 day or 90 day supply? 90

## 2016-05-11 ENCOUNTER — Other Ambulatory Visit: Payer: Self-pay | Admitting: Cardiovascular Disease

## 2016-05-12 NOTE — Telephone Encounter (Signed)
Will you please review for refill, Thanks! 

## 2016-09-10 ENCOUNTER — Other Ambulatory Visit: Payer: Self-pay | Admitting: Cardiovascular Disease

## 2016-09-13 NOTE — Telephone Encounter (Signed)
REFILL 

## 2016-09-13 NOTE — Telephone Encounter (Signed)
Please review for refill, Thanks !  
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
## 2016-10-19 ENCOUNTER — Ambulatory Visit (INDEPENDENT_AMBULATORY_CARE_PROVIDER_SITE_OTHER): Payer: Self-pay | Admitting: Family Medicine

## 2016-10-19 ENCOUNTER — Encounter: Payer: Self-pay | Admitting: Family Medicine

## 2016-10-19 VITALS — BP 126/82 | HR 91 | Resp 12 | Ht 69.0 in | Wt 213.1 lb

## 2016-10-19 DIAGNOSIS — E785 Hyperlipidemia, unspecified: Secondary | ICD-10-CM

## 2016-10-19 DIAGNOSIS — I10 Essential (primary) hypertension: Secondary | ICD-10-CM

## 2016-10-19 DIAGNOSIS — I2510 Atherosclerotic heart disease of native coronary artery without angina pectoris: Secondary | ICD-10-CM

## 2016-10-19 DIAGNOSIS — I5022 Chronic systolic (congestive) heart failure: Secondary | ICD-10-CM

## 2016-10-19 DIAGNOSIS — K219 Gastro-esophageal reflux disease without esophagitis: Secondary | ICD-10-CM

## 2016-10-19 MED ORDER — PANTOPRAZOLE SODIUM 40 MG PO TBEC: 40.0000 mg | DELAYED_RELEASE_TABLET | Freq: Every day | ORAL | 2 refills | Status: DC

## 2016-10-19 MED ORDER — CARVEDILOL 3.125 MG PO TABS: 3.1250 mg | ORAL_TABLET | Freq: Two times a day (BID) | ORAL | 0 refills | Status: DC

## 2016-10-19 MED ORDER — ATORVASTATIN CALCIUM 80 MG PO TABS: 80.0000 mg | ORAL_TABLET | Freq: Every day | ORAL | 1 refills | Status: DC

## 2016-10-19 MED ORDER — CLOPIDOGREL BISULFATE 75 MG PO TABS: 75.0000 mg | ORAL_TABLET | Freq: Every day | ORAL | 1 refills | Status: DC

## 2016-10-19 MED ORDER — LISINOPRIL 5 MG PO TABS: 5.0000 mg | ORAL_TABLET | Freq: Every day | ORAL | 1 refills | Status: DC

## 2016-10-19 MED ORDER — ISOSORBIDE MONONITRATE ER 30 MG PO TB24: 30.0000 mg | ORAL_TABLET | Freq: Every day | ORAL | 1 refills | Status: DC

## 2016-10-19 MED ORDER — NITROGLYCERIN 0.4 MG SL SUBL: 0.4000 mg | SUBLINGUAL_TABLET | SUBLINGUAL | 0 refills | Status: AC | PRN

## 2016-10-19 NOTE — Patient Instructions (Signed)
A few things to remember from today's visit:   Essential hypertension  Coronary arteriosclerosis in native artery  Gastroesophageal reflux disease, esophagitis presence not specified  Blood pressure goal for most people is less than 140/90.   Most recent cardiologists' recommendations recommend blood pressure at or less than 130/80.   Elevated blood pressure increases the risk of strokes, heart and kidney disease, and eye problems. Regular physical activity and a healthy diet (DASH diet) usually help. Low salt diet. Take medications as instructed.  Caution with some over the counter medications as cold medications, dietary products (for weight loss), and Ibuprofen or Aleve (frequent use);all these medications could cause elevation of blood pressure.   Fat and Cholesterol Restricted Diet Getting too much fat and cholesterol in your diet may cause health problems. Following this diet helps keep your fat and cholesterol at normal levels. This can keep you from getting sick. What types of fat should I choose?  Choose monosaturated and polyunsaturated fats. These are found in foods such as olive oil, canola oil, flaxseeds, walnuts, almonds, and seeds.  Eat more omega-3 fats. Good choices include salmon, mackerel, sardines, tuna, flaxseed oil, and ground flaxseeds.  Limit saturated fats. These are in animal products such as meats, butter, and cream. They can also be in plant products such as palm oil, palm kernel oil, and coconut oil.  Avoid foods with partially hydrogenated oils in them. These contain trans fats. Examples of foods that have trans fats are stick margarine, some tub margarines, cookies, crackers, and other baked goods. What general guidelines do I need to follow?  Check food labels. Look for the words "trans fat" and "saturated fat."  When preparing a meal: ? Fill half of your plate with vegetables and green salads. ? Fill one fourth of your plate with whole grains.  Look for the word "whole" as the first word in the ingredient list. ? Fill one fourth of your plate with lean protein foods.  Eat more foods that have fiber, like apples, carrots, beans, peas, and barley.  Eat more home-cooked foods. Eat less at restaurants and buffets.  Limit or avoid alcohol.  Limit foods high in starch and sugar.  Limit fried foods.  Cook foods without frying them. Baking, boiling, grilling, and broiling are all great options.  Lose weight if you are overweight. Losing even a small amount of weight can help your overall health. It can also help prevent diseases such as diabetes and heart disease. What foods can I eat? Grains Whole grains, such as whole wheat or whole grain breads, crackers, cereals, and pasta. Unsweetened oatmeal, bulgur, barley, quinoa, or brown rice. Corn or whole wheat flour tortillas. Vegetables Fresh or frozen vegetables (raw, steamed, roasted, or grilled). Green salads. Fruits All fresh, canned (in natural juice), or frozen fruits. Meat and Other Protein Products Ground beef (85% or leaner), grass-fed beef, or beef trimmed of fat. Skinless chicken or [place]. Ground chicken or [place]. Pork trimmed of fat. All fish and seafood. Eggs. Dried beans, peas, or lentils. Unsalted nuts or seeds. Unsalted canned or dry beans. Dairy Low-fat dairy products, such as skim or 1% milk, 2% or reduced-fat cheeses, low-fat ricotta or cottage cheese, or plain low-fat yogurt. Fats and Oils Tub margarines without trans fats. Light or reduced-fat mayonnaise and salad dressings. Avocado. Olive, canola, sesame, or safflower oils. Natural peanut or almond butter (choose ones without added sugar and oil). The items listed above may not be a complete list of recommended foods or  beverages. Contact your dietitian for more options. What foods are not recommended? Grains White bread. White pasta. White rice. Cornbread. Bagels, pastries, and croissants. Crackers that contain  trans fat. Vegetables White potatoes. Corn. Creamed or fried vegetables. Vegetables in a cheese sauce. Fruits Dried fruits. Canned fruit in light or heavy syrup. Fruit juice. Meat and Other Protein Products Fatty cuts of meat. Ribs, chicken wings, bacon, sausage, bologna, salami, chitterlings, fatback, hot dogs, bratwurst, and packaged luncheon meats. Liver and organ meats. Dairy Whole or 2% milk, cream, half-and-half, and cream cheese. Whole milk cheeses. Whole-fat or sweetened yogurt. Full-fat cheeses. Nondairy creamers and whipped toppings. Processed cheese, cheese spreads, or cheese curds. Sweets and Desserts Corn syrup, sugars, honey, and molasses. Candy. Jam and jelly. Syrup. Sweetened cereals. Cookies, pies, cakes, donuts, muffins, and ice cream. Fats and Oils Butter, stick margarine, lard, shortening, ghee, or bacon fat. Coconut, palm kernel, or palm oils. Beverages Alcohol. Sweetened drinks (such as sodas, lemonade, and fruit drinks or punches). The items listed above may not be a complete list of foods and beverages to avoid. Contact your dietitian for more information. This information is not intended to replace advice given to you by your health care provider. Make sure you discuss any questions you have with your health care provider. Document Released: 07/06/2011 Document Revised: 09/11/2015 Document Reviewed: 04/05/2013 Elsevier Interactive Patient Education  [employer].  Please be sure medication list is accurate. If a new problem present, please set up appointment sooner than planned today.

## 2016-11-19 ENCOUNTER — Other Ambulatory Visit: Payer: Self-pay

## 2016-11-26 ENCOUNTER — Other Ambulatory Visit (INDEPENDENT_AMBULATORY_CARE_PROVIDER_SITE_OTHER): Payer: Self-pay

## 2016-11-26 DIAGNOSIS — I2510 Atherosclerotic heart disease of native coronary artery without angina pectoris: Secondary | ICD-10-CM

## 2016-11-26 DIAGNOSIS — E785 Hyperlipidemia, unspecified: Secondary | ICD-10-CM

## 2016-11-26 DIAGNOSIS — I10 Essential (primary) hypertension: Secondary | ICD-10-CM

## 2016-11-26 LAB — COMPREHENSIVE METABOLIC PANEL
ALK PHOS: 98 U/L (ref 39–117)
ALT: 29 U/L (ref 0–53)
AST: 18 U/L (ref 0–37)
Albumin: 4.2 g/dL (ref 3.5–5.2)
BUN: 19 mg/dL (ref 6–23)
CO2: 28 meq/L (ref 19–32)
Calcium: 9 mg/dL (ref 8.4–10.5)
Chloride: 103 mEq/L (ref 96–112)
Creatinine, Ser: 1 mg/dL (ref 0.40–1.50)
GFR: 83.1 mL/min (ref >60.00)
GLUCOSE: 104 mg/dL — AB (ref 70–99)
POTASSIUM: 4 meq/L (ref 3.5–5.1)
SODIUM: 139 meq/L (ref 135–145)
TOTAL PROTEIN: 6.8 g/dL (ref 6.0–8.3)
Total Bilirubin: 0.6 mg/dL (ref 0.2–1.2)

## 2016-11-26 LAB — LIPID PANEL
CHOL/HDL RATIO: 4
Cholesterol: 137 mg/dL (ref 0–200)
HDL: 35.6 mg/dL — ABNORMAL LOW (ref >39.00)
LDL Cholesterol: 77 mg/dL (ref 0–99)
NONHDL: 101.79
Triglycerides: 122 mg/dL (ref 0.0–149.0)
VLDL: 24.4 mg/dL (ref 0.0–40.0)

## 2016-12-22 IMAGING — DX DG CHEST 2V
2 series · 2 of 2 positions shown · non-contrast
Comparison: Chest radiograph November 21, 2014

CLINICAL DATA: Chest pain and shortness of breath waking up patient
tonight. Hand numbness. History of stroke, heart attack.

EXAM:
CHEST  2 VIEW

[chest pa]
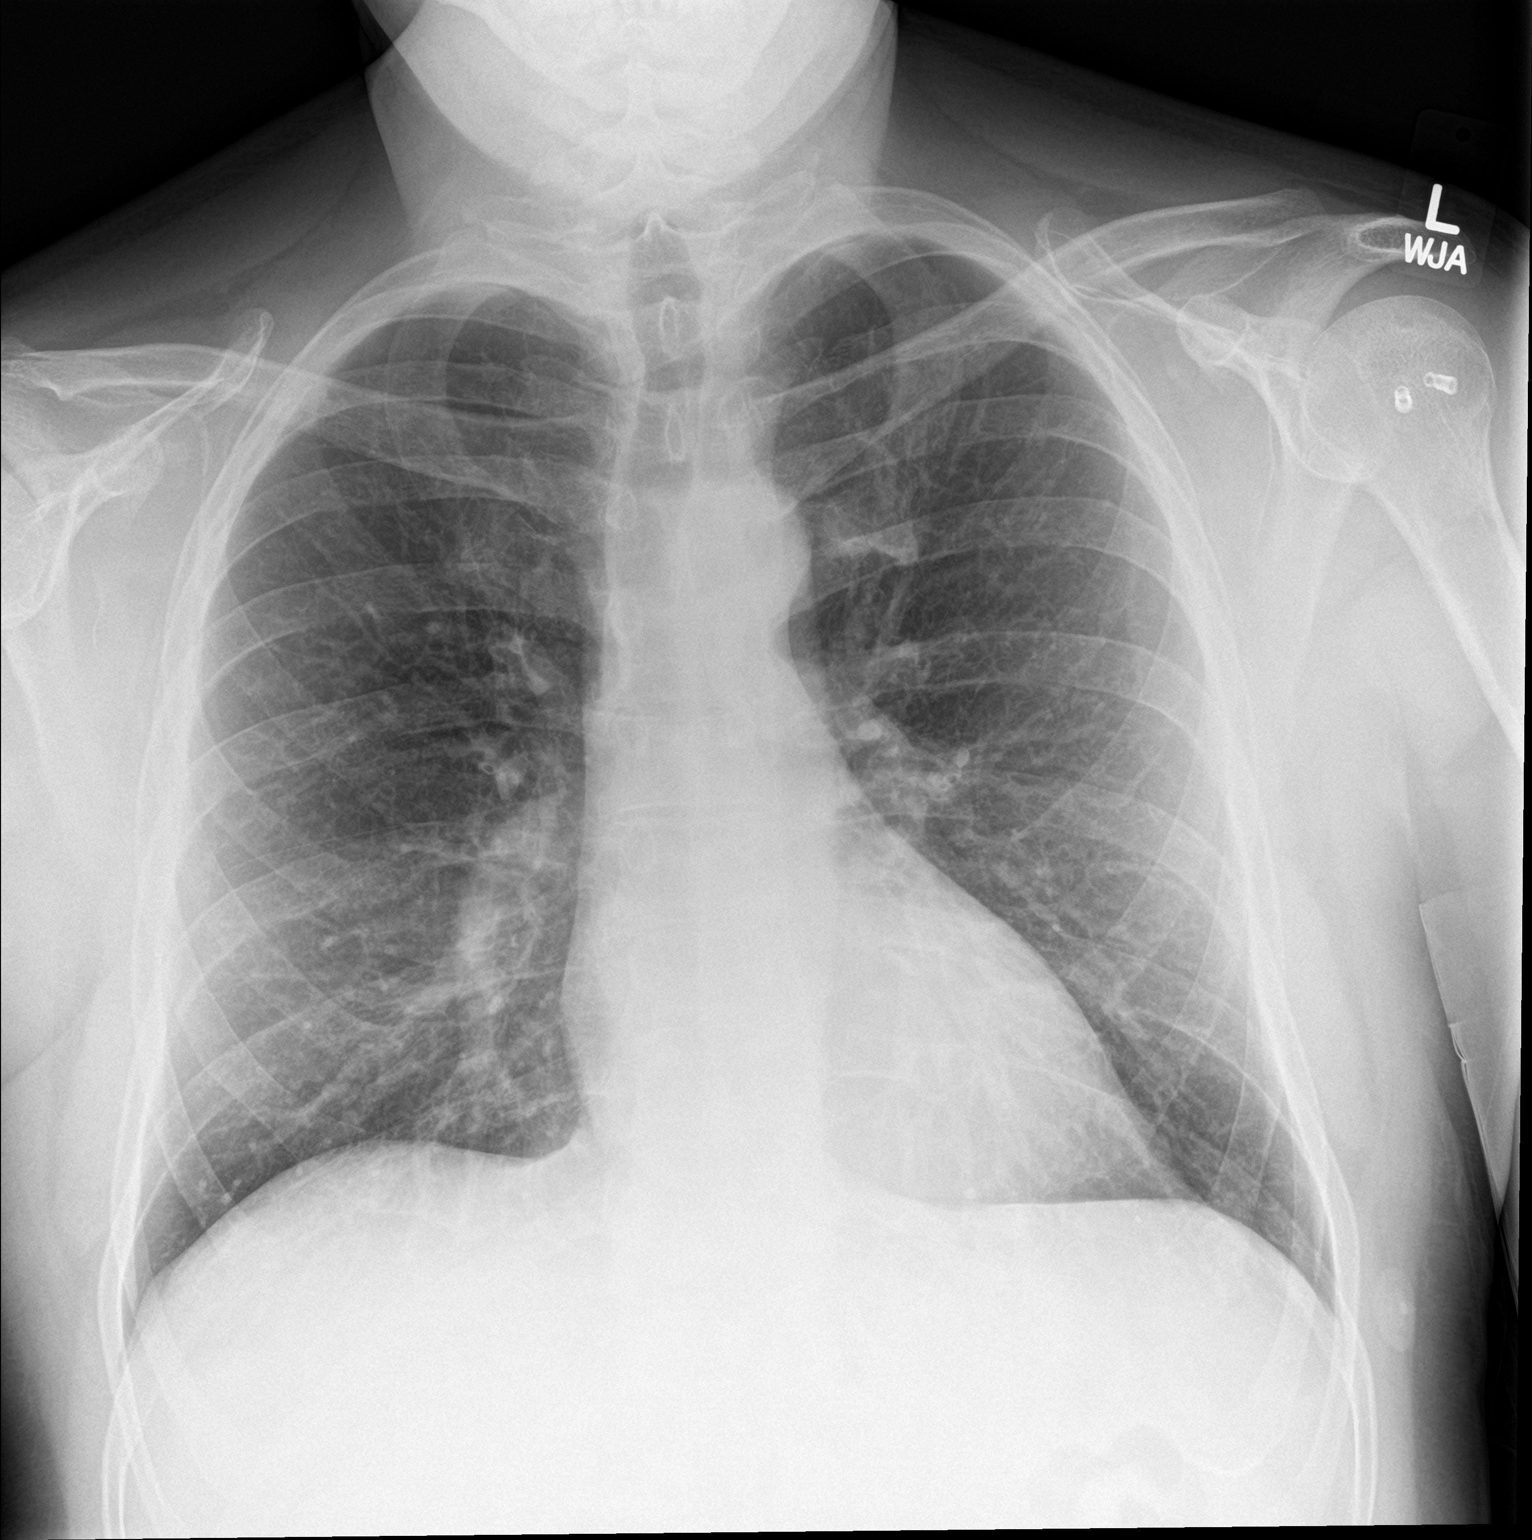

[chest lat]
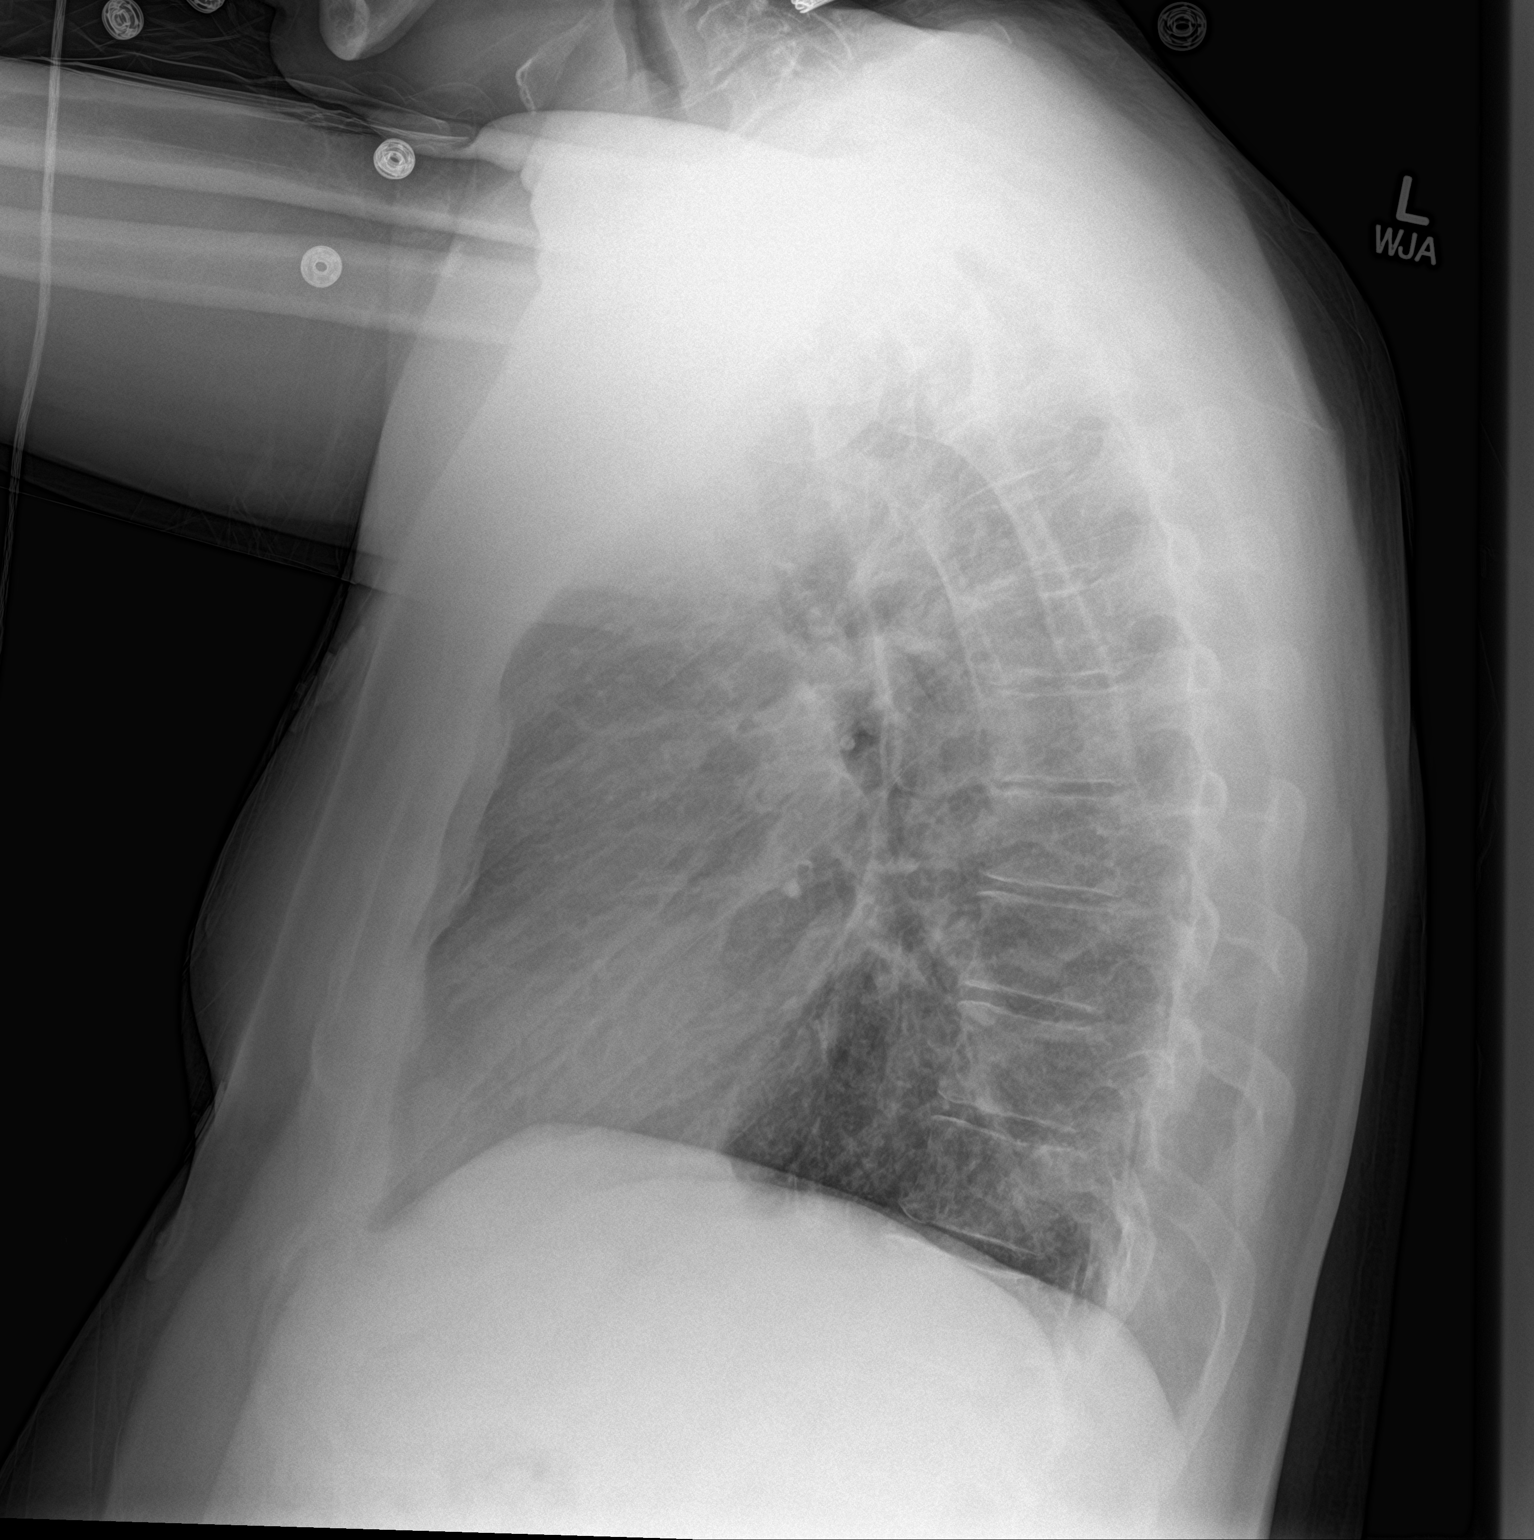

[2 of 2 positions shown; findings below may reference images not displayed]

FINDINGS: Cardiomediastinal silhouette is normal. No pleural effusions or
focal consolidations. Trachea projects midline and there is no
pneumothorax. Soft tissue planes and included osseous structures are
non-suspicious. Suture anchors LEFT humeral head.
IMPRESSION: Normal chest radiograph.

## 2017-03-11 ENCOUNTER — Other Ambulatory Visit: Payer: Self-pay | Admitting: Family Medicine

## 2017-03-11 DIAGNOSIS — I5022 Chronic systolic (congestive) heart failure: Secondary | ICD-10-CM

## 2017-03-11 DIAGNOSIS — I10 Essential (primary) hypertension: Secondary | ICD-10-CM

## 2017-04-15 ENCOUNTER — Other Ambulatory Visit: Payer: Self-pay | Admitting: Family Medicine

## 2017-04-15 DIAGNOSIS — I5022 Chronic systolic (congestive) heart failure: Secondary | ICD-10-CM

## 2017-04-15 DIAGNOSIS — I10 Essential (primary) hypertension: Secondary | ICD-10-CM
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
## 2017-04-19 ENCOUNTER — Ambulatory Visit (INDEPENDENT_AMBULATORY_CARE_PROVIDER_SITE_OTHER): Payer: Self-pay | Admitting: Family Medicine

## 2017-04-19 ENCOUNTER — Encounter: Payer: Self-pay | Admitting: Family Medicine

## 2017-04-19 VITALS — BP 126/84 | HR 70 | Temp 98.0°F | Resp 12 | Ht 69.0 in | Wt 218.4 lb

## 2017-04-19 DIAGNOSIS — I10 Essential (primary) hypertension: Secondary | ICD-10-CM

## 2017-04-19 DIAGNOSIS — I2510 Atherosclerotic heart disease of native coronary artery without angina pectoris: Secondary | ICD-10-CM

## 2017-04-19 DIAGNOSIS — I5022 Chronic systolic (congestive) heart failure: Secondary | ICD-10-CM

## 2017-04-19 DIAGNOSIS — R0600 Dyspnea, unspecified: Secondary | ICD-10-CM

## 2017-04-19 DIAGNOSIS — Z113 Encounter for screening for infections with a predominantly sexual mode of transmission: Secondary | ICD-10-CM

## 2017-04-19 DIAGNOSIS — L989 Disorder of the skin and subcutaneous tissue, unspecified: Secondary | ICD-10-CM

## 2017-04-19 DIAGNOSIS — Q5522 Retractile testis: Secondary | ICD-10-CM

## 2017-04-19 DIAGNOSIS — E785 Hyperlipidemia, unspecified: Secondary | ICD-10-CM

## 2017-04-19 DIAGNOSIS — Z720 Tobacco use: Secondary | ICD-10-CM

## 2017-04-19 MED ORDER — LISINOPRIL 5 MG PO TABS: 5.0000 mg | ORAL_TABLET | Freq: Every day | ORAL | 1 refills | Status: DC

## 2017-04-19 MED ORDER — ATORVASTATIN CALCIUM 80 MG PO TABS: 80.0000 mg | ORAL_TABLET | Freq: Every day | ORAL | 1 refills | Status: DC

## 2017-04-19 MED ORDER — ISOSORBIDE MONONITRATE ER 30 MG PO TB24: 30.0000 mg | ORAL_TABLET | Freq: Every day | ORAL | 1 refills | Status: DC

## 2017-04-19 MED ORDER — VARENICLINE TARTRATE 0.5 MG PO TABS
ORAL_TABLET | ORAL | 1 refills | Status: AC
Start: 2017-04-19 — End: ?

## 2017-04-19 MED ORDER — TRIAMCINOLONE ACETONIDE 0.5 % EX OINT: 1.0000 "application " | TOPICAL_OINTMENT | Freq: Two times a day (BID) | CUTANEOUS | 1 refills | Status: AC

## 2017-04-19 MED ORDER — ALBUTEROL SULFATE HFA 108 (90 BASE) MCG/ACT IN AERS: 2.0000 | INHALATION_SPRAY | Freq: Four times a day (QID) | RESPIRATORY_TRACT | 0 refills | Status: AC | PRN

## 2017-04-19 NOTE — Assessment & Plan Note (Signed)
Asymptomatic. He has no health insurance , so has not followed with cardiologist. Continue Carvedilol and Imdur as well as statin and Plavix.

## 2017-04-19 NOTE — Patient Instructions (Addendum)
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   Question of COPD.  So today we agree on holding on further workup for testicular issues. Smoking cessation is strongly recommended. Please be sure medication list is accurate. If a new problem present, please set up appointment sooner than planned today.

## 2017-04-19 NOTE — Assessment & Plan Note (Signed)
Adequately controlled. No changes in current management. DASH diet recommended. Eye exam recommended annually. F/U in 6 months, before if needed.  

## 2017-04-19 NOTE — Assessment & Plan Note (Signed)
Adverse effects of tobacco and benefits of smoking cessation discussed. Treatment options: Nicotine gum, Wellbutrin, and Chantix emergency room. He would like to try Chantix again, I recommend a lower dose.  We discussed some side effects.

## 2017-04-27 ENCOUNTER — Other Ambulatory Visit: Payer: Self-pay | Admitting: Family Medicine

## 2017-04-27 DIAGNOSIS — I10 Essential (primary) hypertension: Secondary | ICD-10-CM

## 2017-04-27 DIAGNOSIS — I5022 Chronic systolic (congestive) heart failure: Secondary | ICD-10-CM

## 2017-05-15 ENCOUNTER — Other Ambulatory Visit: Payer: Self-pay | Admitting: Family Medicine

## 2017-05-15 DIAGNOSIS — I2510 Atherosclerotic heart disease of native coronary artery without angina pectoris: Secondary | ICD-10-CM

## 2017-05-17 ENCOUNTER — Other Ambulatory Visit: Payer: Self-pay | Admitting: Family Medicine

## 2017-05-17 DIAGNOSIS — I2510 Atherosclerotic heart disease of native coronary artery without angina pectoris: Secondary | ICD-10-CM

## 2017-06-01 ENCOUNTER — Other Ambulatory Visit: Payer: Self-pay | Admitting: Family Medicine

## 2017-06-01 DIAGNOSIS — E785 Hyperlipidemia, unspecified: Secondary | ICD-10-CM

## 2017-06-24 ENCOUNTER — Other Ambulatory Visit: Payer: Self-pay | Admitting: Family Medicine

## 2017-06-24 DIAGNOSIS — I2510 Atherosclerotic heart disease of native coronary artery without angina pectoris: Secondary | ICD-10-CM

## 2017-06-24 NOTE — Telephone Encounter (Signed)
Last filled 05/17/17 #30 Last OV 04/19/17  Please advise if okay to refill the Plavix. Thanks.

## 2017-06-27 ENCOUNTER — Other Ambulatory Visit: Payer: Self-pay | Admitting: Family Medicine

## 2017-06-27 DIAGNOSIS — E785 Hyperlipidemia, unspecified: Secondary | ICD-10-CM

## 2017-07-17 ENCOUNTER — Other Ambulatory Visit: Payer: Self-pay | Admitting: Family Medicine

## 2017-07-17 DIAGNOSIS — I10 Essential (primary) hypertension: Secondary | ICD-10-CM

## 2017-07-17 DIAGNOSIS — I5022 Chronic systolic (congestive) heart failure: Secondary | ICD-10-CM

## 2017-07-26 ENCOUNTER — Other Ambulatory Visit: Payer: Self-pay | Admitting: Family Medicine

## 2017-07-26 DIAGNOSIS — E785 Hyperlipidemia, unspecified: Secondary | ICD-10-CM

## 2017-07-28 ENCOUNTER — Other Ambulatory Visit: Payer: Self-pay | Admitting: Family Medicine

## 2017-07-28 DIAGNOSIS — K219 Gastro-esophageal reflux disease without esophagitis: Secondary | ICD-10-CM

## 2017-08-22 ENCOUNTER — Telehealth: Payer: Self-pay | Admitting: Family Medicine

## 2017-08-22 ENCOUNTER — Other Ambulatory Visit: Payer: Self-pay | Admitting: Family Medicine

## 2017-08-22 DIAGNOSIS — I5022 Chronic systolic (congestive) heart failure: Secondary | ICD-10-CM

## 2017-08-22 DIAGNOSIS — K219 Gastro-esophageal reflux disease without esophagitis: Secondary | ICD-10-CM

## 2017-08-22 DIAGNOSIS — I10 Essential (primary) hypertension: Secondary | ICD-10-CM

## 2017-08-22 NOTE — Telephone Encounter (Signed)
Message sent to Dr. Jordan for review. 

## 2017-08-22 NOTE — Telephone Encounter (Signed)
Copied from CRM [id number]. Topic: Inquiry >> Aug 22, 2017 11:19 AM Burchel, Abbi R wrote: Reason for CRM:  Pt is moving to [place] soon and would like to know if Dr SwazilandJordan has any recommendations for PCP and Cardiologists in the area.  Please call pt to advise.     Pt: [phone]

## 2017-08-26 NOTE — Telephone Encounter (Signed)
Spoke with patient and he wanted to know if he could get a 3 or 6 month supply of his medications until he find a provider in the [place] area.

## 2017-08-26 NOTE — Telephone Encounter (Signed)
Sorry but I am not familiar with health care providers in [place].  Thanks, BJ

## 2017-08-30 NOTE — Telephone Encounter (Signed)
3 month supply can be ordered. Does he need Rx printed?  Thanks, BJ

## 2017-08-31 NOTE — Telephone Encounter (Signed)
Tried calling patient, unable to leave message, vm not set up.

## 2017-09-02 ENCOUNTER — Other Ambulatory Visit: Payer: Self-pay | Admitting: *Deleted

## 2017-09-02 DIAGNOSIS — K219 Gastro-esophageal reflux disease without esophagitis: Secondary | ICD-10-CM

## 2017-09-02 MED ORDER — PANTOPRAZOLE SODIUM 40 MG PO TBEC: 40.0000 mg | DELAYED_RELEASE_TABLET | Freq: Every day | ORAL | 0 refills | Status: DC

## 2017-09-02 NOTE — Telephone Encounter (Signed)
Rx done. 

## 2017-09-05 ENCOUNTER — Other Ambulatory Visit: Payer: Self-pay | Admitting: *Deleted

## 2017-09-05 DIAGNOSIS — I10 Essential (primary) hypertension: Secondary | ICD-10-CM

## 2017-09-05 DIAGNOSIS — I5022 Chronic systolic (congestive) heart failure: Secondary | ICD-10-CM

## 2017-09-05 MED ORDER — CARVEDILOL 3.125 MG PO TABS: 3.1250 mg | ORAL_TABLET | Freq: Two times a day (BID) | ORAL | 0 refills | Status: DC

## 2017-09-05 NOTE — Telephone Encounter (Signed)
Left detailed message for patient to return call to clinic concerning Rx's.

## 2017-09-06 ENCOUNTER — Other Ambulatory Visit: Payer: Self-pay | Admitting: *Deleted

## 2017-09-06 DIAGNOSIS — I10 Essential (primary) hypertension: Secondary | ICD-10-CM

## 2017-09-06 DIAGNOSIS — I5022 Chronic systolic (congestive) heart failure: Secondary | ICD-10-CM

## 2017-09-06 MED ORDER — CARVEDILOL 3.125 MG PO TABS: 3.1250 mg | ORAL_TABLET | Freq: Two times a day (BID) | ORAL | 0 refills | Status: AC

## 2017-09-06 NOTE — Telephone Encounter (Signed)
Left message informing patient that he can get 90 day supply of medications.

## 2017-09-19 ENCOUNTER — Other Ambulatory Visit: Payer: Self-pay | Admitting: Family Medicine

## 2017-09-19 DIAGNOSIS — K219 Gastro-esophageal reflux disease without esophagitis: Secondary | ICD-10-CM

## 2017-10-24 ENCOUNTER — Encounter: Attending: Family Medicine | Primary: Family Medicine

## 2017-11-02 ENCOUNTER — Ambulatory Visit: Attending: Family Medicine | Primary: Family Medicine

## 2017-11-02 ENCOUNTER — Ambulatory Visit: Admit: 2017-11-02 | Discharge: 2017-11-02 | Attending: Family Medicine | Primary: Family Medicine

## 2017-11-02 DIAGNOSIS — I2510 Atherosclerotic heart disease of native coronary artery without angina pectoris: Secondary | ICD-10-CM

## 2017-11-02 MED ORDER — CARVEDILOL 3.125 MG TAB
3.125 mg | ORAL_TABLET | Freq: Two times a day (BID) | ORAL | 5 refills | Status: DC
Start: 2017-11-02 — End: 2017-11-02

## 2017-11-02 MED ORDER — CLOPIDOGREL 75 MG TAB
75 mg | ORAL_TABLET | Freq: Every day | ORAL | 1 refills | Status: DC
Start: 2017-11-02 — End: 2018-06-05

## 2017-11-02 MED ORDER — ATORVASTATIN 80 MG TAB
80 mg | ORAL_TABLET | Freq: Every day | ORAL | 1 refills | Status: DC
Start: 2017-11-02 — End: 2018-06-05

## 2017-11-02 MED ORDER — CLOPIDOGREL 75 MG TAB
75 mg | ORAL_TABLET | Freq: Every day | ORAL | 5 refills | Status: DC
Start: 2017-11-02 — End: 2017-11-02

## 2017-11-02 MED ORDER — ATORVASTATIN 80 MG TAB
80 mg | ORAL_TABLET | Freq: Every day | ORAL | 5 refills | Status: DC
Start: 2017-11-02 — End: 2017-11-02

## 2017-11-02 MED ORDER — LISINOPRIL 5 MG TAB
5 mg | ORAL_TABLET | Freq: Every day | ORAL | 1 refills | Status: DC
Start: 2017-11-02 — End: 2018-10-05

## 2017-11-02 MED ORDER — CARVEDILOL 3.125 MG TAB
3.125 mg | ORAL_TABLET | Freq: Two times a day (BID) | ORAL | 1 refills | Status: DC
Start: 2017-11-02 — End: 2018-09-01

## 2017-11-02 MED ORDER — ISOSORBIDE MONONITRATE SR 30 MG 24 HR TAB
30 mg | ORAL_TABLET | Freq: Every day | ORAL | 1 refills | Status: DC
Start: 2017-11-02 — End: 2018-10-05

## 2017-11-02 MED ORDER — LISINOPRIL 5 MG TAB
5 mg | ORAL_TABLET | Freq: Every day | ORAL | 5 refills | Status: DC
Start: 2017-11-02 — End: 2017-11-02

## 2017-11-02 MED ORDER — ISOSORBIDE MONONITRATE SR 30 MG 24 HR TAB
30 mg | ORAL_TABLET | Freq: Every day | ORAL | 5 refills | Status: DC
Start: 2017-11-02 — End: 2017-11-02

## 2017-11-02 NOTE — Progress Notes (Signed)
[facility]  [address] The[address]ma [address]  [address]  [place], [place] [id number]  [phone]    Michael MinsJohn Haney, DOB: Feb 17, 1965, AGE: 52 y.o.  Chief Complaint   Patient presents with   ??? Establish Care     Pt is non-fasting.    ??? Referral Request     Cardiology Pt has h/o cardiac stent on 08/15/2012.   ??? Hypertension   ??? Cholesterol Problem   ??? Medication Refill     all meds   ??? Other     Pt is needing medical release to return to work.      Visit Vitals  BP 129/82 (BP 1 Location: Left arm, BP Patient Position: Sitting)   Pulse 68   Resp 16   Ht 5' 7.5" (1.715 m)   Wt 202 lb 6.4 oz (91.8 kg)   SpO2 98%   BMI 31.23 kg/m??     Social History     Tobacco Use   ??? Smoking status: Current Every Day Smoker     Packs/day: 1.00     Years: 38.00     Pack years: 38.00   ??? Smokeless tobacco: Never Used   Substance Use Topics   ??? Alcohol use: Yes     Comment: less than once a week, about once a month   ??? Drug use: Never     Current Outpatient Medications   Medication Sig Dispense Refill   ??? atorvastatin (LIPITOR) 80 mg tablet Take 1 Tab by mouth daily. 90 Tab 1   ??? carvedilol (COREG) 3.125 mg tablet Take 1 Tab by mouth two (2) times daily (with meals). 180 Tab 1   ??? clopidogrel (PLAVIX) 75 mg tab Take 1 Tab by mouth daily. 90 Tab 1   ??? isosorbide mononitrate ER (IMDUR) 30 mg tablet Take 1 Tab by mouth daily. 90 Tab 1   ??? lisinopril (PRINIVIL, ZESTRIL) 5 mg tablet Take 1 Tab by mouth daily. 90 Tab 1     No Known Allergies  Health Maintenance Topics with due status: Overdue       Topic Date Due    Hepatitis C Screening Feb 17, 1965    Pneumococcal 0-64 years 12/27/1969    DTaP/Tdap/Td series 12/27/1984    Shingrix Vaccine Age 17> 12/27/2013    FOBT Q 1 YEAR AGE 42-75 12/27/2013    Influenza Age 60 to Adult 08/18/2017       HPI   History of Present Illness  Hypertension    The history is provided by the patient. This is a chronic problem. The current episode started more than 1 week ago. The problem has been gradually  improving. Pertinent negatives include no chest pain, no orthopnea, no palpitations, no headaches, no peripheral edema, no dizziness, no shortness of breath, no nausea and no vomiting.   Cholesterol Problem   This is a chronic problem. The current episode started more than 1 week ago. The problem occurs constantly. The problem has not changed since onset.Pertinent negatives include no chest pain, no abdominal pain, no headaches and no shortness of breath.   GERD   This is a chronic problem. The current episode started more than 1 week ago. The problem has been gradually improving. Pertinent negatives include no chest pain, no abdominal pain, no headaches and no shortness of breath.   Other   This is a new (HE NEEDS A NOTE TO RETURN TO WORK AND MUST SEE A SPECIALIST) problem. Pertinent negatives include no chest pain,  no abdominal pain, no headaches and no shortness of breath.       Reviewed pertinent past medical history, surgical history, social history, and family history as noted in patient record.     ROS  Review of Systems   Constitutional: Negative for fever.   Respiratory: Negative for shortness of breath.    Cardiovascular: Negative for chest pain, palpitations, orthopnea and leg swelling.   Gastrointestinal: Negative for abdominal pain, diarrhea, nausea and vomiting.   Neurological: Negative for dizziness and headaches.       Physical Exam   Constitutional: He is well-developed, well-nourished, and in no distress.   HENT:   Head: Normocephalic and atraumatic.   Eyes: Conjunctivae are normal.   Neck: Neck supple.   Cardiovascular: Normal rate, regular rhythm and normal heart sounds.   Pulmonary/Chest: Effort normal and breath sounds normal.   Musculoskeletal: He exhibits no edema.   Neurological: He is alert.   Psychiatric: Affect normal.   Nursing note and vitals reviewed.      Lab Results  No results found for this or any previous visit (from the past 8 hour(s)).    Assessment & Plan  Michael Haney presents  for a chronic care visit. Pre-visit preparations were complete prior to appointment. Barriers to treatment goals include: none  Self-management abilities include: takes medication as prescribed, diet, exercise, attempts to manage weight, checks blood sugar, checks blood pressure, keeps logs, smoking cessation    Diagnoses and all orders for this visit:    1. Coronary artery disease due to lipid rich plaque  -     AMB POC EKG ROUTINE W/ 12 LEADS, INTER & REP-SINUS BRADYCARDIA ; OLD ANTEROSEPTAL INFARCT--NONSPECIFIC T WAVE ABNORMALITY  -     METABOLIC PANEL, COMPREHENSIVE; Future  -     CBC WITH AUTOMATED DIFF; Future  -     REFERRAL TO CARDIOLOGY  -     clopidogrel (PLAVIX) 75 mg tab; Take 1 Tab by mouth daily.    2. Other hyperlipidemia  -     LIPID PANEL; Future  -     atorvastatin (LIPITOR) 80 mg tablet; Take 1 Tab by mouth daily.    3. Essential hypertension  -     carvedilol (COREG) 3.125 mg tablet; Take 1 Tab by mouth two (2) times daily (with meals).  -     isosorbide mononitrate ER (IMDUR) 30 mg tablet; Take 1 Tab by mouth daily.  -     lisinopril (PRINIVIL, ZESTRIL) 5 mg tablet; Take 1 Tab by mouth daily.    4. Screening for prostate cancer  -     PSA, DIAGNOSTIC (PROSTATE SPECIFIC AG); Future    5. History of pulmonary embolism-2012    6. History of CVA (cerebrovascular accident)-MINISTROKE 2016    GET RECORDS DR Michael Haney , [place]---[facility] [place] [facility]    Patient and/or caretaker counseled on the importance of balanced diet, exercise, weight management, and medication adherence. Addressed any identified treatment barriers. Care plans, self-management plans, and any necessary patient education handouts and self-management tools provided.          Vivianne Spence, MD

## 2017-11-02 NOTE — Progress Notes (Signed)
[facility]  [address] Thelma Barge  [address]  [place], [place] [address]  [phone]    Michael Haney, DOB: 1965/08/01, AGE: 52 y.o.  Chief Complaint   Patient presents with   ??? Establish Care     Pt is non-fasting.    ??? Referral Request     Cardiology Pt has h/o cardiac stent on 08/15/2012.   ??? Hypertension   ??? Cholesterol Problem   ??? Medication Refill     all meds   ??? Other     Pt is needing medical release to return to work.      Visit Vitals  BP 129/82 (BP 1 Location: Left arm, BP Patient Position: Sitting)   Pulse 68   Resp 16   Ht 5' 7.5" (1.715 m)   Wt 202 lb 6.4 oz (91.8 kg)   SpO2 98%   BMI 31.23 kg/m??     Social History     Tobacco Use   ??? Smoking status: Current Every Day Smoker     Packs/day: 1.00     Years: 38.00     Pack years: 38.00   ??? Smokeless tobacco: Never Used   Substance Use Topics   ??? Alcohol use: Yes     Comment: less than once a week, about once a month   ??? Drug use: Never     Current Outpatient Medications   Medication Sig Dispense Refill   ??? atorvastatin (LIPITOR) 80 mg tablet Take 1 Tab by mouth daily. 90 Tab 1   ??? carvedilol (COREG) 3.125 mg tablet Take 1 Tab by mouth two (2) times daily (with meals). 180 Tab 1   ??? clopidogrel (PLAVIX) 75 mg tab Take 1 Tab by mouth daily. 90 Tab 1   ??? isosorbide mononitrate ER (IMDUR) 30 mg tablet Take 1 Tab by mouth daily. 90 Tab 1   ??? lisinopril (PRINIVIL, ZESTRIL) 5 mg tablet Take 1 Tab by mouth daily. 90 Tab 1     No Known Allergies  Health Maintenance Topics with due status: Overdue       Topic Date Due    Hepatitis C Screening 07/01/1965    Pneumococcal 0-64 years 12/27/1969    DTaP/Tdap/Td series 12/27/1984    Shingrix Vaccine Age 72> 12/27/2013    FOBT Q 1 YEAR AGE 11-75 12/27/2013    Influenza Age 55 to Adult 08/18/2017       HPI   History of Present Illness  Hypertension    The history is provided by the patient. This is a chronic problem. The current episode started more than 1 week ago. The problem has been  gradually improving. Pertinent negatives include no chest pain, no orthopnea, no palpitations, no headaches, no peripheral edema, no dizziness, no shortness of breath, no nausea and no vomiting.   Cholesterol Problem   This is a chronic problem. The current episode started more than 1 week ago. The problem occurs constantly. The problem has not changed since onset.Pertinent negatives include no chest pain, no abdominal pain, no headaches and no shortness of breath.   GERD   This is a chronic problem. The current episode started more than 1 week ago. The problem has been gradually improving. Pertinent negatives include no chest pain, no abdominal pain, no headaches and no shortness of breath.   Other   This is a new (HE NEEDS A NOTE TO RETURN TO WORK AND MUST SEE A SPECIALIST) problem. Pertinent negatives include no chest pain,  no abdominal pain, no headaches and no shortness of breath.       Reviewed pertinent past medical history, surgical history, social history, and family history as noted in patient record.     ROS  Review of Systems   Constitutional: Negative for fever.   Respiratory: Negative for shortness of breath.    Cardiovascular: Negative for chest pain, palpitations, orthopnea and leg swelling.   Gastrointestinal: Negative for abdominal pain, diarrhea, nausea and vomiting.   Neurological: Negative for dizziness and headaches.       Physical Exam   Constitutional: He is well-developed, well-nourished, and in no distress.   HENT:   Head: Normocephalic and atraumatic.   Eyes: Conjunctivae are normal.   Neck: Neck supple.   Cardiovascular: Normal rate, regular rhythm and normal heart sounds.   Pulmonary/Chest: Effort normal and breath sounds normal.   Musculoskeletal: He exhibits no edema.   Neurological: He is alert.   Psychiatric: Affect normal.   Nursing note and vitals reviewed.      Lab Results  No results found for this or any previous visit (from the past 8 hour(s)).    Assessment & Plan   Michael Haney presents for a chronic care visit. Pre-visit preparations were complete prior to appointment. Barriers to treatment goals include: none  Self-management abilities include: takes medication as prescribed, diet, exercise, attempts to manage weight, checks blood sugar, checks blood pressure, keeps logs, smoking cessation    Diagnoses and all orders for this visit:    1. Coronary artery disease due to lipid rich plaque  -     AMB POC EKG ROUTINE W/ 12 LEADS, INTER & REP-SINUS BRADYCARDIA ; OLD ANTEROSEPTAL INFARCT--NONSPECIFIC T WAVE ABNORMALITY  -     METABOLIC PANEL, COMPREHENSIVE; Future  -     CBC WITH AUTOMATED DIFF; Future  -     REFERRAL TO CARDIOLOGY  -     clopidogrel (PLAVIX) 75 mg tab; Take 1 Tab by mouth daily.    2. Other hyperlipidemia  -     LIPID PANEL; Future  -     atorvastatin (LIPITOR) 80 mg tablet; Take 1 Tab by mouth daily.    3. Essential hypertension  -     carvedilol (COREG) 3.125 mg tablet; Take 1 Tab by mouth two (2) times daily (with meals).  -     isosorbide mononitrate ER (IMDUR) 30 mg tablet; Take 1 Tab by mouth daily.  -     lisinopril (PRINIVIL, ZESTRIL) 5 mg tablet; Take 1 Tab by mouth daily.    4. Screening for prostate cancer  -     PSA, DIAGNOSTIC (PROSTATE SPECIFIC AG); Future    5. History of pulmonary embolism-2012    6. History of CVA (cerebrovascular accident)-MINISTROKE 2016    GET RECORDS DR BETTY Sw[place] , [place]---DR [facility] [place] [facility]    Patient and/or caretaker counseled on the importance of balanced diet, exercise, weight management, and medication adherence. Addressed any identified treatment barriers. Care plans, self-management plans, and any necessary patient education handouts and self-management tools provided.          Vivianne Spence, MD

## 2017-11-03 ENCOUNTER — Other Ambulatory Visit: Admit: 2017-11-03 | Discharge: 2017-11-03 | Primary: Family Medicine

## 2017-11-03 DIAGNOSIS — Z125 Encounter for screening for malignant neoplasm of prostate: Secondary | ICD-10-CM

## 2017-11-03 NOTE — Progress Notes (Signed)
Faxed labs

## 2017-11-03 NOTE — Progress Notes (Signed)
LVM telling pt to call back.

## 2017-11-03 NOTE — Progress Notes (Signed)
Patient notified of lab results and voiced understanding. Pt [facility] this afternoons appointment.

## 2017-11-03 NOTE — Progress Notes (Signed)
Notify that labs are STABLE--

## 2017-11-03 NOTE — Progress Notes (Signed)
Faxed labs

## 2017-11-03 NOTE — Progress Notes (Signed)
Notify that labs are STABLE--

## 2017-11-03 NOTE — Progress Notes (Signed)
Patient notified of lab results and voiced understanding. Pt requests records to be sent to [facility] for this afternoons appointment.

## 2017-11-04 LAB — CBC WITH AUTOMATED DIFF
ABS. BASOPHILS: 0.1 10*3/uL (ref 0.0–0.2)
ABS. EOSINOPHILS: 0.1 10*3/uL (ref 0.0–0.4)
ABS. IMM. GRANS.: 0 10*3/uL (ref 0.0–0.1)
ABS. MONOCYTES: 0.5 10*3/uL (ref 0.1–0.9)
ABS. NEUTROPHILS: 4.3 10*3/uL (ref 1.4–7.0)
Abs Lymphocytes: 1.4 10*3/uL (ref 0.7–3.1)
BASOPHILS: 2 %
EOSINOPHILS: 2 %
HCT: 51.5 % — ABNORMAL HIGH (ref 37.5–51.0)
HGB: 17.4 g/dL (ref 13.0–17.7)
IMMATURE GRANULOCYTES: 0 %
Lymphocytes: 22 %
MCH: 29.5 pg (ref 26.6–33.0)
MCHC: 33.8 g/dL (ref 31.5–35.7)
MCV: 87 fL (ref 79–97)
MONOCYTES: 8 %
NEUTROPHILS: 66 %
PLATELET: 205 10*3/uL (ref 150–450)
RBC: 5.9 x10E6/uL — ABNORMAL HIGH (ref 4.14–5.80)
RDW: 12.1 % — ABNORMAL LOW (ref 12.3–15.4)
WBC: 6.5 10*3/uL (ref 3.4–10.8)

## 2017-11-04 LAB — METABOLIC PANEL, COMPREHENSIVE
A-G Ratio: 1.6 (ref 1.2–2.2)
ALT (SGPT): 25 IU/L (ref 0–44)
AST (SGOT): 16 IU/L (ref 0–40)
Albumin: 3.9 g/dL (ref 3.5–5.5)
Alk. phosphatase: 84 IU/L (ref 39–117)
BUN/Creatinine ratio: 14 (ref 9–20)
BUN: 13 mg/dL (ref 6–24)
Bilirubin, total: 0.4 mg/dL (ref 0.0–1.2)
CO2: 24 mmol/L (ref 20–29)
Calcium: 9.1 mg/dL (ref 8.7–10.2)
Chloride: 106 mmol/L (ref 96–106)
Creatinine: 0.92 mg/dL (ref 0.76–1.27)
GFR est AA: 109 mL/min/{1.73_m2} (ref >59)
GFR est non-AA: 95 mL/min/{1.73_m2} (ref >59)
GLOBULIN, TOTAL: 2.5 g/dL (ref 1.5–4.5)
Glucose: 98 mg/dL (ref 65–99)
Potassium: 4.5 mmol/L (ref 3.5–5.2)
Protein, total: 6.4 g/dL (ref 6.0–8.5)
Sodium: 145 mmol/L — ABNORMAL HIGH (ref 134–144)

## 2017-11-04 LAB — LIPID PANEL
Cholesterol, Total: 116 mg/dL (ref 100–199)
Cholesterol, total: 116 mg/dL (ref 100–199)
HDL Cholesterol: 39 mg/dL — ABNORMAL LOW (ref >39)
HDL: 39 mg/dL — ABNORMAL LOW (ref >39)
LDL Calculated: 59 mg/dL (ref 0–99)
LDL, calculated: 59 mg/dL (ref 0–99)
Triglyceride: 88 mg/dL (ref 0–149)
Triglycerides: 88 mg/dL (ref 0–149)
VLDL Cholesterol Calculated: 18 mg/dL (ref 5–40)
VLDL, calculated: 18 mg/dL (ref 5–40)

## 2017-11-04 LAB — PSA, DIAGNOSTIC (PROSTATE SPECIFIC AG): Prostate Specific Ag: 2.5 ng/mL (ref 0.0–4.0)

## 2017-11-04 LAB — CBC WITH AUTO DIFFERENTIAL
Basophils %: 2 %
Basophils Absolute: 0.1 10*3/uL (ref 0.0–0.2)
Eosinophils %: 2 %
Eosinophils Absolute: 0.1 10*3/uL (ref 0.0–0.4)
Granulocyte Absolute Count: 0 10*3/uL (ref 0.0–0.1)
Hematocrit: 51.5 % — ABNORMAL HIGH (ref 37.5–51.0)
Hemoglobin: 17.4 g/dL (ref 13.0–17.7)
Immature Granulocytes: 0 %
Lymphocytes %: 22 %
Lymphocytes Absolute: 1.4 10*3/uL (ref 0.7–3.1)
MCH: 29.5 pg (ref 26.6–33.0)
MCHC: 33.8 g/dL (ref 31.5–35.7)
MCV: 87 fL (ref 79–97)
Monocytes %: 8 %
Monocytes Absolute: 0.5 10*3/uL (ref 0.1–0.9)
Neutrophils %: 66 %
Neutrophils Absolute: 4.3 10*3/uL (ref 1.4–7.0)
Platelets: 205 10*3/uL (ref 150–450)
RBC: 5.9 x10E6/uL — ABNORMAL HIGH (ref 4.14–5.80)
RDW: 12.1 % — ABNORMAL LOW (ref 12.3–15.4)
WBC: 6.5 10*3/uL (ref 3.4–10.8)

## 2017-11-04 LAB — COMPREHENSIVE METABOLIC PANEL
ALT: 25 IU/L (ref 0–44)
AST: 16 IU/L (ref 0–40)
Albumin/Globulin Ratio: 1.6 NA (ref 1.2–2.2)
Albumin: 3.9 g/dL (ref 3.5–5.5)
Alkaline Phosphatase: 84 IU/L (ref 39–117)
BUN: 13 mg/dL (ref 6–24)
Bun/Cre Ratio: 14 NA (ref 9–20)
CO2: 24 mmol/L (ref 20–29)
Calcium: 9.1 mg/dL (ref 8.7–10.2)
Chloride: 106 mmol/L (ref 96–106)
Creatinine: 0.92 mg/dL (ref 0.76–1.27)
EGFR IF NonAfrican American: 95 mL/min/{1.73_m2} (ref >59)
GFR African American: 109 mL/min/{1.73_m2} (ref >59)
Globulin, Total: 2.5 g/dL (ref 1.5–4.5)
Glucose: 98 mg/dL (ref 65–99)
Potassium: 4.5 mmol/L (ref 3.5–5.2)
Sodium: 145 mmol/L — ABNORMAL HIGH (ref 134–144)
Total Bilirubin: 0.4 mg/dL (ref 0.0–1.2)
Total Protein: 6.4 g/dL (ref 6.0–8.5)

## 2017-11-04 LAB — PSA PROSTATIC SPECIFIC ANTIGEN: PSA: 2.5 ng/mL (ref 0.0–4.0)

## 2017-11-08 ENCOUNTER — Ambulatory Visit: Attending: Internal Medicine | Primary: Family Medicine

## 2017-11-08 ENCOUNTER — Ambulatory Visit: Admit: 2017-11-08 | Discharge: 2017-11-08 | Attending: Internal Medicine | Primary: Family Medicine

## 2017-11-08 DIAGNOSIS — I2510 Atherosclerotic heart disease of native coronary artery without angina pectoris: Secondary | ICD-10-CM

## 2017-11-08 MED ORDER — BUPROPION SR 150 MG TAB
150 mg | ORAL_TABLET | Freq: Two times a day (BID) | ORAL | 0 refills | Status: DC
Start: 2017-11-08 — End: 2018-06-29

## 2017-11-08 NOTE — Progress Notes (Signed)
Progress Notes by Abner Greenspan, MD at 11/08/17 1600                Author: Abner Greenspan, MD  Service: --  Author Type: Physician       Filed: 11/09/17 1427  Encounter Date: 11/08/2017  Status: Signed          Editor: Abner Greenspan, MD (Physician)          Related Notes: Original Note by Abner Greenspan, MD (Physician) filed at 11/08/17 [address], [address]   [place] [address]   PHONE: [phone]      SUBJECTIVE:    Michael Haney is a 52 y.o.  male 1965-09-16    seen for a consultation visit regarding the following:         Chief Complaint       Patient presents with        ?  Referral / Consult                     Consultation is requested by Alvino Blood for evaluation of Referral / Consult    .History of Present Illness:  The patient presented for consultation 11/08/2017.  Previous history of coronary artery disease, status post MI in 2014, recurrent cardiac catheterization.   The patient moved from [place] in 2019.  Previously a Production designer, theatre/television/film.  The patient has a previous history of tobacco abuse.  No symptoms of chest discomfort at this time, still smoking.        Cardiac History:     1.  MI in 2014 in [place].  PCI to LAD, 3.5 x 18 Xience drug eluting stent.   2.  ECG 11/02/2017 - sinus rhythm with anterior septal infarct, age indeterminate.       Assessment and Plan:     1.  Tobacco abuse, uncontrolled.  Discussed smoking cessation counseling.  The patient has intolerances to Chantix in the past, started on Wellbutrin.   Discussed the importance of smoking cessation.     2.  Coronary disease, controlled.  No symptoms at this time.  Continue high intensity statin therapy.     3.  History of stroke?  The patient was seen by neurology in the past and is on chronic Plavix therapy.     4.  Hypertension, controlled.     5.  History of pulmonary embolus, provoked.  No indications for anticoagulation at this time.             :          Past Medical History, Past Surgical History, Family history, Social History, and Medications were all reviewed with the patient today and updated as necessary.         Outpatient Medications Marked as Taking for the 11/08/17 encounter (Office Visit) with Abner Greenspan, MD          Medication  Sig  Dispense  Refill           ?  atorvastatin (LIPITOR) 80 mg tablet  Take 1 Tab by mouth daily.  90 Tab  1     ?  carvedilol (COREG) 3.125 mg tablet  Take 1 Tab by mouth two (2) times daily (with meals).  180 Tab  1     ?  clopidogrel (  PLAVIX) 75 mg tab  Take 1 Tab by mouth daily.  90 Tab  1     ?  isosorbide mononitrate ER (IMDUR) 30 mg tablet  Take 1 Tab by mouth daily.  90 Tab  1           ?  lisinopril (PRINIVIL, ZESTRIL) 5 mg tablet  Take 1 Tab by mouth daily.  90 Tab  1        No Known Allergies     Past Medical History:        Diagnosis  Date         ?  GERD (gastroesophageal reflux disease)       ?  Hypercholesterolemia       ?  Hypertension       ?  Myocardial infarct, old  2014     ?  PE (pulmonary thromboembolism) ([place])  2012     ?  Stroke [facility])  2016          "mini stroke"          Past Surgical History:         Procedure  Laterality  Date          ?  HX CORONARY STENT PLACEMENT    08/15/2012     ?  HX LUMBAR DISKECTOMY    2015     ?  HX ROTATOR CUFF REPAIR  Left  2003          ?  HX ROTATOR CUFF REPAIR  Right  2016        No family history on file.     Social History          Tobacco Use         ?  Smoking status:  Current Every Day Smoker              Packs/day:  1.00         Years:  38.00         Pack years:  38.00         ?  Smokeless tobacco:  Never Used       Substance Use Topics         ?  Alcohol use:  Yes             Comment: less than once a week, about once a month           ROS:      ROS          PHYSICAL EXAM:       Visit Vitals      BP  108/78     Pulse  84     Ht  '5\' 7"'$  (1.702 m)     Wt  203 lb 9.6 oz (92.4 kg)        BMI  31.89 kg/m??            Physical Exam    Constitutional: He is oriented to  person, place, and time. He appears well-developed.    HENT:    Head: Normocephalic and atraumatic.    Eyes: Pupils are equal, round, and reactive to light.    Neck: Normal range of motion.    Cardiovascular: Normal rate.    No murmur heard.   Pulmonary/Chest: Effort normal.    Abdominal: Soft. He exhibits no distension. There is no tenderness.   Musculoskeletal: He exhibits no edema.   Neurological: He is alert and oriented  to  person, place, and time. No cranial nerve deficit.    Skin: Skin is warm and dry. No rash noted. No erythema.   Psychiatric: He has a normal mood and affect. His behavior is normal.           Medical problems and test results were reviewed with the patient today.       No results found for any visits on 11/08/17.           Recent Results (from the past 672 hour(s))     PSA, DIAGNOSTIC (PROSTATE SPECIFIC AG)          Collection Time: 11/03/17  8:53 AM         Result  Value  Ref Range            Prostate Specific Ag  2.5  0.0 - 4.0 ng/mL       LIPID PANEL          Collection Time: 11/03/17  8:53 AM         Result  Value  Ref Range            Cholesterol, total  116  100 - 199 mg/dL       Triglyceride  88  0 - 149 mg/dL       HDL Cholesterol  39 (L)  >39 mg/dL       VLDL, calculated  18  5 - 40 mg/dL       LDL, calculated  59  0 - 99 mg/dL       CBC WITH AUTOMATED DIFF          Collection Time: 11/03/17  8:53 AM         Result  Value  Ref Range            WBC  6.5  3.4 - 10.8 x10E3/uL       RBC  5.90 (H)  4.14 - 5.80 x10E6/uL       HGB  17.4  13.0 - 17.7 g/dL       HCT  51.5 (H)  37.5 - 51.0 %       MCV  87  79 - 97 fL       MCH  29.5  26.6 - 33.0 pg       MCHC  33.8  31.5 - 35.7 g/dL       RDW  12.1 (L)  12.3 - 15.4 %       PLATELET  205  150 - 450 x10E3/uL       NEUTROPHILS  66  Not Estab. %       Lymphocytes  22  Not Estab. %       MONOCYTES  8  Not Estab. %       EOSINOPHILS  2  Not Estab. %       BASOPHILS  2  Not Estab. %       ABS. NEUTROPHILS  4.3  1.4 - 7.0 x10E3/uL       Abs  Lymphocytes  1.4  0.7 - 3.1 x10E3/uL       ABS. MONOCYTES  0.5  0.1 - 0.9 x10E3/uL       ABS. EOSINOPHILS  0.1  0.0 - 0.4 x10E3/uL       ABS. BASOPHILS  0.1  0.0 - 0.2 x10E3/uL       IMMATURE GRANULOCYTES  0  Not Estab. %       ABS. IMM.  GRANS.  0.0  0.0 - 0.1 Y86V7/QI       METABOLIC PANEL, COMPREHENSIVE          Collection Time: 11/03/17  8:53 AM         Result  Value  Ref Range            Glucose  98  65 - 99 mg/dL       BUN  13  6 - 24 mg/dL       Creatinine  0.92  0.76 - 1.27 mg/dL       GFR est non-AA  95  >59 mL/min/1.73       GFR est AA  109  >59 mL/min/1.73       BUN/Creatinine ratio  14  9 - 20       Sodium  145 (H)  134 - 144 mmol/L       Potassium  4.5  3.5 - 5.2 mmol/L       Chloride  106  96 - 106 mmol/L       CO2  24  20 - 29 mmol/L       Calcium  9.1  8.7 - 10.2 mg/dL       Protein, total  6.4  6.0 - 8.5 g/dL       Albumin  3.9  3.5 - 5.5 g/dL       GLOBULIN, TOTAL  2.5  1.5 - 4.5 g/dL       A-G Ratio  1.6  1.2 - 2.2       Bilirubin, total  0.4  0.0 - 1.2 mg/dL       Alk. phosphatase  84  39 - 117 IU/L       AST (SGOT)  16  0 - 40 IU/L            ALT (SGPT)  25  0 - 44 IU/L          Lab Results         Component  Value  Date/Time            Cholesterol, total  116  11/03/2017 08:53 AM       HDL Cholesterol  39 (L)  11/03/2017 08:53 AM       LDL, calculated  59  11/03/2017 08:53 AM       VLDL, calculated  18  11/03/2017 08:53 AM            Triglyceride  88  11/03/2017 08:53 AM                  PLAN      Diagnoses and all orders for this visit:      1. Coronary artery disease involving native heart, angina presence unspecified, unspecified vessel or lesion type      2. Hyperlipidemia LDL goal <70      3. Hypertension, essential      4. Hx of pulmonary embolus      5. Tobacco abuse      Other orders   -     buPROPion SR (WELLBUTRIN SR) 150 mg SR tablet; Take 1 Tab by mouth two (2) times a day. Take 1 tablet daily for the first 3 days.  Initiate 2 weeks before smoking cessation  begins.  Take for 8  weeks.  Indications: Stop Smoking               Follow-up and Dispositions      ??  Return in about 1 year (around 11/09/2018).                   Thank you for allowing me to participate in this patient's care.  Please call or contact me if there are any questions or concerns regarding the above.        Abner Greenspan, MD   11/08/17   4:07 PM         Proofread, but unrecognized errors may exist.

## 2017-11-08 NOTE — Progress Notes (Signed)
[address], [address]  [place] [address]  PHONE: [phone]    SUBJECTIVE:   Michael Haney is a 52 y.o. male Mar 31, 1965   seen for a consultation visit regarding the following:     Chief Complaint   Patient presents with   ??? Referral / Consult              Consultation is requested by Alvino Blood for evaluation of Referral / Consult   .History of Present Illness:  The patient presented for consultation 11/08/2017.  Previous history of coronary artery disease, status post MI in 2014, recurrent cardiac catheterization.  The patient moved from [place] in 2019.  Previously a Production designer, theatre/television/film.  The patient has a previous history of tobacco abuse.  No symptoms of chest discomfort at this time, still smoking.      Cardiac History:    1. MI in 2014 in [place].  PCI to LAD, 3.5 x 18 Xience drug eluting stent.  2. ECG 11/02/2017 - sinus rhythm with anterior septal infarct, age indeterminate.     Assessment and Plan:    1. Tobacco abuse, uncontrolled.  Discussed smoking cessation counseling.  The patient has intolerances to Chantix in the past, started on Wellbutrin.  Discussed the importance of smoking cessation.    2. Coronary disease, controlled.  No symptoms at this time.  Continue high intensity statin therapy.    3. History of stroke?  The patient was seen by neurology in the past and is on chronic Plavix therapy.    4. Hypertension, controlled.    5. History of pulmonary embolus, provoked.  No indications for anticoagulation at this time.         :      Past Medical History, Past Surgical History, Family history, Social History, and Medications were all reviewed with the patient today and updated as necessary.     Outpatient Medications Marked as Taking for the 11/08/17 encounter (Office Visit) with Abner Greenspan, MD   Medication Sig Dispense Refill   ??? atorvastatin (LIPITOR) 80 mg tablet Take 1 Tab by mouth daily. 90 Tab 1    ??? carvedilol (COREG) 3.125 mg tablet Take 1 Tab by mouth two (2) times daily (with meals). 180 Tab 1   ??? clopidogrel (PLAVIX) 75 mg tab Take 1 Tab by mouth daily. 90 Tab 1   ??? isosorbide mononitrate ER (IMDUR) 30 mg tablet Take 1 Tab by mouth daily. 90 Tab 1   ??? lisinopril (PRINIVIL, ZESTRIL) 5 mg tablet Take 1 Tab by mouth daily. 48 Tab 1     No Known Allergies  Past Medical History:   Diagnosis Date   ??? GERD (gastroesophageal reflux disease)    ??? Hypercholesterolemia    ??? Hypertension    ??? Myocardial infarct, old 2014   ??? PE (pulmonary thromboembolism) ([place]) 2012   ??? Stroke ([place]) 2016    "mini stroke"     Past Surgical History:   Procedure Laterality Date   ??? HX CORONARY STENT PLACEMENT  08/15/2012   ??? HX LUMBAR DISKECTOMY  2015   ??? HX ROTATOR CUFF REPAIR Left 2003   ??? HX ROTATOR CUFF REPAIR Right 2016     No family history on file.  Social History     Tobacco Use   ??? Smoking status: Current Every Day Smoker     Packs/day: 1.00     Years: 38.00     Pack years: 38.00   ??? Smokeless tobacco:  Never Used   Substance Use Topics   ??? Alcohol use: Yes     Comment: less than once a week, about once a month       ROS:    ROS       PHYSICAL EXAM:     Visit Vitals  BP 108/78   Pulse 84   Ht 5' 7"  (1.702 m)   Wt 203 lb 9.6 oz (92.4 kg)   BMI 31.89 kg/m??        Physical Exam   Constitutional: He is oriented to person, place, and time. He appears well-developed.   HENT:   Head: Normocephalic and atraumatic.   Eyes: Pupils are equal, round, and reactive to light.   Neck: Normal range of motion.   Cardiovascular: Normal rate.   No murmur heard.  Pulmonary/Chest: Effort normal.   Abdominal: Soft. He exhibits no distension. There is no tenderness.   Musculoskeletal: He exhibits no edema.   Neurological: He is alert and oriented to person, place, and time. No cranial nerve deficit.   Skin: Skin is warm and dry. No rash noted. No erythema.   Psychiatric: He has a normal mood and affect. His behavior is normal.        Medical problems and test results were reviewed with the patient today.     No results found for any visits on 11/08/17.      Recent Results (from the past 672 hour(s))   PSA, DIAGNOSTIC (PROSTATE SPECIFIC AG)    Collection Time: 11/03/17  8:53 AM   Result Value Ref Range    Prostate Specific Ag 2.5 0.0 - 4.0 ng/mL   LIPID PANEL    Collection Time: 11/03/17  8:53 AM   Result Value Ref Range    Cholesterol, total 116 100 - 199 mg/dL    Triglyceride 88 0 - 149 mg/dL    HDL Cholesterol 39 (L) >39 mg/dL    VLDL, calculated 18 5 - 40 mg/dL    LDL, calculated 59 0 - 99 mg/dL   CBC WITH AUTOMATED DIFF    Collection Time: 11/03/17  8:53 AM   Result Value Ref Range    WBC 6.5 3.4 - 10.8 x10E3/uL    RBC 5.90 (H) 4.14 - 5.80 x10E6/uL    HGB 17.4 13.0 - 17.7 g/dL    HCT 51.5 (H) 37.5 - 51.0 %    MCV 87 79 - 97 fL    MCH 29.5 26.6 - 33.0 pg    MCHC 33.8 31.5 - 35.7 g/dL    RDW 12.1 (L) 12.3 - 15.4 %    PLATELET 205 150 - 450 x10E3/uL    NEUTROPHILS 66 Not Estab. %    Lymphocytes 22 Not Estab. %    MONOCYTES 8 Not Estab. %    EOSINOPHILS 2 Not Estab. %    BASOPHILS 2 Not Estab. %    ABS. NEUTROPHILS 4.3 1.4 - 7.0 x10E3/uL    Abs Lymphocytes 1.4 0.7 - 3.1 x10E3/uL    ABS. MONOCYTES 0.5 0.1 - 0.9 x10E3/uL    ABS. EOSINOPHILS 0.1 0.0 - 0.4 x10E3/uL    ABS. BASOPHILS 0.1 0.0 - 0.2 x10E3/uL    IMMATURE GRANULOCYTES 0 Not Estab. %    ABS. IMM. GRANS. 0.0 0.0 - 0.1 M57Q4/ON   METABOLIC PANEL, COMPREHENSIVE    Collection Time: 11/03/17  8:53 AM   Result Value Ref Range    Glucose 98 65 - 99 mg/dL    BUN 13 6 -  24 mg/dL    Creatinine 0.92 0.76 - 1.27 mg/dL    GFR est non-AA 95 >59 mL/min/1.73    GFR est AA 109 >59 mL/min/1.73    BUN/Creatinine ratio 14 9 - 20    Sodium 145 (H) 134 - 144 mmol/L    Potassium 4.5 3.5 - 5.2 mmol/L    Chloride 106 96 - 106 mmol/L    CO2 24 20 - 29 mmol/L    Calcium 9.1 8.7 - 10.2 mg/dL    Protein, total 6.4 6.0 - 8.5 g/dL    Albumin 3.9 3.5 - 5.5 g/dL    GLOBULIN, TOTAL 2.5 1.5 - 4.5 g/dL     A-G Ratio 1.6 1.2 - 2.2    Bilirubin, total 0.4 0.0 - 1.2 mg/dL    Alk. phosphatase 84 39 - 117 IU/L    AST (SGOT) 16 0 - 40 IU/L    ALT (SGPT) 25 0 - 44 IU/L     Lab Results   Component Value Date/Time    Cholesterol, total 116 11/03/2017 08:53 AM    HDL Cholesterol 39 (L) 11/03/2017 08:53 AM    LDL, calculated 59 11/03/2017 08:53 AM    VLDL, calculated 18 11/03/2017 08:53 AM    Triglyceride 88 11/03/2017 08:53 AM            PLAN    Diagnoses and all orders for this visit:    1. Coronary artery disease involving native heart, angina presence unspecified, unspecified vessel or lesion type    2. Hyperlipidemia LDL goal <70    3. Hypertension, essential    4. Hx of pulmonary embolus    5. Tobacco abuse    Other orders  -     buPROPion SR (WELLBUTRIN SR) 150 mg SR tablet; Take 1 Tab by mouth two (2) times a day. Take 1 tablet daily for the first 3 days.  Initiate 2 weeks before smoking cessation begins.  Take for 8 weeks.  Indications: Stop Smoking         Follow-up and Dispositions    ?? Return in about 1 year (around 11/09/2018).            Thank you for allowing me to participate in this patient's care.  Please call or contact me if there are any questions or concerns regarding the above.      Abner Greenspan, MD  11/08/17  4:07 PM      Proofread, but unrecognized errors may exist.

## 2017-11-09 NOTE — Telephone Encounter (Signed)
Pt called wanting to speak with you, would not provide any information to me. His phone number is [phone]

## 2017-11-10 NOTE — Telephone Encounter (Signed)
Pt has forms for his medications in order to return to work. Pt just needs forms filled out. I told pt we would do our best tomorrow depending on how early he gets them here and how involved the paperwork is.

## 2017-11-18 ENCOUNTER — Other Ambulatory Visit: Payer: Self-pay | Admitting: Family Medicine

## 2017-11-18 DIAGNOSIS — I5022 Chronic systolic (congestive) heart failure: Secondary | ICD-10-CM

## 2017-11-18 DIAGNOSIS — I10 Essential (primary) hypertension: Secondary | ICD-10-CM

## 2017-11-29 NOTE — Telephone Encounter (Signed)
Pharmacy requesting a refill on protonix 40mg 

## 2017-12-01 NOTE — Telephone Encounter (Signed)
Declined as pt reported he no longer takes this as of last visit.

## 2017-12-03 ENCOUNTER — Other Ambulatory Visit: Payer: Self-pay | Admitting: Family Medicine

## 2017-12-03 DIAGNOSIS — K219 Gastro-esophageal reflux disease without esophagitis: Secondary | ICD-10-CM

## 2018-02-03 ENCOUNTER — Encounter: Attending: Family Medicine | Primary: Family Medicine

## 2018-02-10 ENCOUNTER — Encounter: Attending: Family Medicine | Primary: Family Medicine

## 2018-02-23 ENCOUNTER — Ambulatory Visit: Attending: Family Medicine | Primary: Family Medicine

## 2018-02-23 ENCOUNTER — Ambulatory Visit
Admit: 2018-02-23 | Discharge: 2018-02-23 | Payer: PRIVATE HEALTH INSURANCE | Attending: Family Medicine | Primary: Family Medicine

## 2018-02-23 DIAGNOSIS — I2510 Atherosclerotic heart disease of native coronary artery without angina pectoris: Secondary | ICD-10-CM

## 2018-02-23 MED ORDER — NITROGLYCERIN 0.4 MG SUBLINGUAL TAB
0.4 mg | SUBLINGUAL | 0 refills | Status: AC | PRN
Start: 2018-02-23 — End: ?

## 2018-02-23 MED ORDER — LORATADINE 10 MG TAB
10 mg | ORAL_TABLET | Freq: Every day | ORAL | 2 refills | Status: AC
Start: 2018-02-23 — End: ?

## 2018-02-23 NOTE — Progress Notes (Signed)
[facility]  [address] Thelma Barge  [address]  [place], [place] [address]  [phone]    Michael Haney, DOB: 1965/11/19, AGE: 53 y.o.  Chief Complaint   Patient presents with   ??? Follow Up Chronic Condition     Pt is non-fasting. Pt is self pay.    ??? Other     Pt states he has BM's everyday, but sometimes he has solid BMs and sometimes its "like water".    ??? Dizziness     Pt c/o of vertigo "every now and again".      Visit Vitals  BP 92/68 (BP 1 Location: Left arm, BP Patient Position: Sitting)   Pulse 77   Resp 16   Ht 5\' 7"  (1.702 m)   Wt 211 lb (95.7 kg)   SpO2 97%   BMI 33.05 kg/m??     Social History     Tobacco Use   ??? Smoking status: Current Every Day Smoker     Packs/day: 1.00     Years: 38.00     Pack years: 38.00   ??? Smokeless tobacco: Never Used   Substance Use Topics   ??? Alcohol use: Yes     Comment: less than once a week, about once a month   ??? Drug use: Never     Current Outpatient Medications   Medication Sig Dispense Refill   ??? nitroglycerin (NITROSTAT) 0.4 mg SL tablet 1 Tab by SubLINGual route every five (5) minutes as needed for Chest Pain. Up to 3 doses. 1 Bottle 0   ??? loratadine (CLARITIN) 10 mg tablet Take 1 Tab by mouth daily. 30 Tab 2   ??? buPROPion SR (WELLBUTRIN SR) 150 mg SR tablet Take 1 Tab by mouth two (2) times a day. Take 1 tablet daily for the first 3 days.  Initiate 2 weeks before smoking cessation begins.  Take for 8 weeks.  Indications: Stop Smoking 120 Tab 0   ??? atorvastatin (LIPITOR) 80 mg tablet Take 1 Tab by mouth daily. 90 Tab 1   ??? carvedilol (COREG) 3.125 mg tablet Take 1 Tab by mouth two (2) times daily (with meals). 180 Tab 1   ??? clopidogrel (PLAVIX) 75 mg tab Take 1 Tab by mouth daily. 90 Tab 1   ??? isosorbide mononitrate ER (IMDUR) 30 mg tablet Take 1 Tab by mouth daily. 90 Tab 1   ??? lisinopril (PRINIVIL, ZESTRIL) 5 mg tablet Take 1 Tab by mouth daily. 90 Tab 1     Not on File  Health Maintenance Topics with due status: Overdue       Topic Date Due     Hepatitis C Screening 09[birth year]63    Pneumococcal 0-64 years 12/27/1969    DTaP/Tdap/Td series 12/28/1974    Shingrix Vaccine Age 31> 12/27/2013    FOBT Q1Y Age 31-75 12/27/2013    Influenza Age 7 to Adult 08/18/2017       HPI   History of Present Illness  Dizziness    The history is provided by the patient. This is a recurrent problem. The current episode started more than 1 week ago. The problem occurs every several days (ONLY WHEN HE TURNS A CERTAIN WAY). The problem has not changed since onset.There was no loss of consciousness. Associated symptoms include dizziness. Pertinent negatives include no chest pain, no confusion, no fever, no nausea and no vomiting.       Reviewed pertinent past medical history, surgical history, social history, and family  history as noted in patient record.     ROS  Review of Systems   Constitutional: Negative for fever.   Respiratory: Negative for shortness of breath.    Cardiovascular: Negative for chest pain and leg swelling.   Gastrointestinal: Negative for diarrhea, nausea and vomiting.   Neurological: Positive for dizziness.   Psychiatric/Behavioral: Negative for confusion.       Physical Exam  Vitals signs and nursing note reviewed.   HENT:      Head: Normocephalic and atraumatic.   Eyes:      Conjunctiva/sclera: Conjunctivae normal.   Neck:      Musculoskeletal: Neck supple.   Cardiovascular:      Rate and Rhythm: Normal rate and regular rhythm.      Heart sounds: Normal heart sounds.   Pulmonary:      Effort: Pulmonary effort is normal.      Breath sounds: Normal breath sounds.   Neurological:      Mental Status: He is alert.   Psychiatric:         Mood and Affect: Affect normal.         Lab Results  No results found for this or any previous visit (from the past 8 hour(s)).    Assessment & Plan  Michael Haney presents for a chronic care visit. Pre-visit preparations were complete prior to appointment. Barriers to treatment goals include: none  Self-management abilities include:  takes medication as prescribed, diet, exercise, attempts to manage weight, checks blood sugar, checks blood pressure, keeps logs, smoking cessation    Diagnoses and all orders for this visit:    1. Coronary artery disease due to lipid rich plaque--PATIENT DENIES CHEST PAIN  -     nitroglycerin (NITROSTAT) 0.4 mg SL tablet; 1 Tab by SubLINGual route every five (5) minutes as needed for Chest Pain. Up to 3 doses.    2. Allergic rhinitis due to other allergic trigger, unspecified seasonality-may be cause of dizziness---RECOMMEND EKG BUT PATIENT WANTS TO WAIT ON THIS AND LABS DUE TO NO INSURANCE  -     loratadine (CLARITIN) 10 mg tablet; Take 1 Tab by mouth daily.    ADVISE TO SET UP PHYSICAL IN 3 MONTHS WHEN HE HAS INSURANCE    Patient and/or caretaker counseled on the importance of balanced diet, exercise, weight management, and medication adherence. Addressed any identified treatment barriers. Care plans, self-management plans, and any necessary patient education handouts and self-management tools provided.    Follow-up and Dispositions    ?? Return in about 3 months (around 05/24/2018).           Michael Spence, MD

## 2018-02-23 NOTE — Progress Notes (Signed)
[facility]  [address] Thelma Barge  [address]  [place], [place] [address]  [phone]    Michael Haney, DOB: 1965/11/19, AGE: 53 y.o.  Chief Complaint   Patient presents with   ??? Follow Up Chronic Condition     Pt is non-fasting. Pt is self pay.    ??? Other     Pt states he has BM's everyday, but sometimes he has solid BMs and sometimes its "like water".    ??? Dizziness     Pt c/o of vertigo "every now and again".      Visit Vitals  BP 92/68 (BP 1 Location: Left arm, BP Patient Position: Sitting)   Pulse 77   Resp 16   Ht 5\' 7"  (1.702 m)   Wt 211 lb (95.7 kg)   SpO2 97%   BMI 33.05 kg/m??     Social History     Tobacco Use   ??? Smoking status: Current Every Day Smoker     Packs/day: 1.00     Years: 38.00     Pack years: 38.00   ??? Smokeless tobacco: Never Used   Substance Use Topics   ??? Alcohol use: Yes     Comment: less than once a week, about once a month   ??? Drug use: Never     Current Outpatient Medications   Medication Sig Dispense Refill   ??? nitroglycerin (NITROSTAT) 0.4 mg SL tablet 1 Tab by SubLINGual route every five (5) minutes as needed for Chest Pain. Up to 3 doses. 1 Bottle 0   ??? loratadine (CLARITIN) 10 mg tablet Take 1 Tab by mouth daily. 30 Tab 2   ??? buPROPion SR (WELLBUTRIN SR) 150 mg SR tablet Take 1 Tab by mouth two (2) times a day. Take 1 tablet daily for the first 3 days.  Initiate 2 weeks before smoking cessation begins.  Take for 8 weeks.  Indications: Stop Smoking 120 Tab 0   ??? atorvastatin (LIPITOR) 80 mg tablet Take 1 Tab by mouth daily. 90 Tab 1   ??? carvedilol (COREG) 3.125 mg tablet Take 1 Tab by mouth two (2) times daily (with meals). 180 Tab 1   ??? clopidogrel (PLAVIX) 75 mg tab Take 1 Tab by mouth daily. 90 Tab 1   ??? isosorbide mononitrate ER (IMDUR) 30 mg tablet Take 1 Tab by mouth daily. 90 Tab 1   ??? lisinopril (PRINIVIL, ZESTRIL) 5 mg tablet Take 1 Tab by mouth daily. 90 Tab 1     Not on File  Health Maintenance Topics with due status: Overdue       Topic Date Due     Hepatitis C Screening 09/29[birth year]63    Pneumococcal 0-64 years 12/27/1969    DTaP/Tdap/Td series 12/28/1974    Shingrix Vaccine Age 31> 12/27/2013    FOBT Q1Y Age 31-75 12/27/2013    Influenza Age 7 to Adult 08/18/2017       HPI   History of Present Illness  Dizziness    The history is provided by the patient. This is a recurrent problem. The current episode started more than 1 week ago. The problem occurs every several days (ONLY WHEN HE TURNS A CERTAIN WAY). The problem has not changed since onset.There was no loss of consciousness. Associated symptoms include dizziness. Pertinent negatives include no chest pain, no confusion, no fever, no nausea and no vomiting.       Reviewed pertinent past medical history, surgical history, social history, and family  history as noted in patient record.     ROS  Review of Systems   Constitutional: Negative for fever.   Respiratory: Negative for shortness of breath.    Cardiovascular: Negative for chest pain and leg swelling.   Gastrointestinal: Negative for diarrhea, nausea and vomiting.   Neurological: Positive for dizziness.   Psychiatric/Behavioral: Negative for confusion.       Physical Exam  Vitals signs and nursing note reviewed.   HENT:      Head: Normocephalic and atraumatic.   Eyes:      Conjunctiva/sclera: Conjunctivae normal.   Neck:      Musculoskeletal: Neck supple.   Cardiovascular:      Rate and Rhythm: Normal rate and regular rhythm.      Heart sounds: Normal heart sounds.   Pulmonary:      Effort: Pulmonary effort is normal.      Breath sounds: Normal breath sounds.   Neurological:      Mental Status: He is alert.   Psychiatric:         Mood and Affect: Affect normal.         Lab Results  No results found for this or any previous visit (from the past 8 hour(s)).    Assessment & Plan  Michael Haney presents for a chronic care visit. Pre-visit preparations were complete prior to appointment. Barriers to treatment goals include: none   Self-management abilities include: takes medication as prescribed, diet, exercise, attempts to manage weight, checks blood sugar, checks blood pressure, keeps logs, smoking cessation    Diagnoses and all orders for this visit:    1. Coronary artery disease due to lipid rich plaque--PATIENT DENIES CHEST PAIN  -     nitroglycerin (NITROSTAT) 0.4 mg SL tablet; 1 Tab by SubLINGual route every five (5) minutes as needed for Chest Pain. Up to 3 doses.    2. Allergic rhinitis due to other allergic trigger, unspecified seasonality-may be cause of dizziness---RECOMMEND EKG BUT PATIENT WANTS TO WAIT ON THIS AND LABS DUE TO NO INSURANCE  -     loratadine (CLARITIN) 10 mg tablet; Take 1 Tab by mouth daily.    ADVISE TO SET UP PHYSICAL IN 3 MONTHS WHEN HE HAS INSURANCE    Patient and/or caretaker counseled on the importance of balanced diet, exercise, weight management, and medication adherence. Addressed any identified treatment barriers. Care plans, self-management plans, and any necessary patient education handouts and self-management tools provided.    Follow-up and Dispositions    ?? Return in about 3 months (around 05/24/2018).           Vivianne Spence, MD

## 2018-02-24 ENCOUNTER — Encounter: Attending: Family Medicine | Primary: Family Medicine

## 2018-03-02 ENCOUNTER — Other Ambulatory Visit: Payer: Self-pay | Admitting: Family Medicine

## 2018-03-02 DIAGNOSIS — K219 Gastro-esophageal reflux disease without esophagitis: Secondary | ICD-10-CM

## 2018-05-25 ENCOUNTER — Encounter: Attending: Family Medicine | Primary: Family Medicine

## 2018-05-27 ENCOUNTER — Other Ambulatory Visit: Payer: Self-pay | Admitting: Family Medicine

## 2018-05-27 DIAGNOSIS — K219 Gastro-esophageal reflux disease without esophagitis: Secondary | ICD-10-CM

## 2018-06-05 ENCOUNTER — Encounter

## 2018-06-05 MED ORDER — CLOPIDOGREL 75 MG TAB
75 mg | ORAL_TABLET | Freq: Every day | ORAL | 1 refills | Status: DC
Start: 2018-06-05 — End: 2018-10-05

## 2018-06-05 MED ORDER — ATORVASTATIN 80 MG TAB
80 mg | ORAL_TABLET | Freq: Every day | ORAL | 1 refills | Status: DC
Start: 2018-06-05 — End: 2018-10-05

## 2018-06-05 NOTE — Telephone Encounter (Signed)
Patient requesting refills on atorvastatin 80mg  , clopidogrel 75mg , and Pantoprazole 40mg .

## 2018-06-29 ENCOUNTER — Ambulatory Visit: Attending: Family Medicine | Primary: Family Medicine

## 2018-06-29 ENCOUNTER — Ambulatory Visit
Admit: 2018-06-29 | Discharge: 2018-06-29 | Payer: PRIVATE HEALTH INSURANCE | Attending: Family Medicine | Primary: Family Medicine

## 2018-06-29 DIAGNOSIS — R12 Heartburn: Secondary | ICD-10-CM

## 2018-06-29 MED ORDER — BUPROPION XL 300 MG 24 HR TAB
300 mg | ORAL_TABLET | ORAL | 3 refills | Status: AC
Start: 2018-06-29 — End: ?

## 2018-06-29 MED ORDER — PANTOPRAZOLE 40 MG TAB, DELAYED RELEASE
40 mg | ORAL_TABLET | Freq: Every day | ORAL | 1 refills | Status: DC
Start: 2018-06-29 — End: 2018-10-26

## 2018-06-29 NOTE — Progress Notes (Signed)
Notify that labs are normal

## 2018-06-29 NOTE — Progress Notes (Signed)
Patient notified of lab results.  He verbalized understanding.

## 2018-06-29 NOTE — Progress Notes (Signed)
[place] [address]  [place], DOB: 05-Sep-1965, AGE: 53 y.o.  Chief Complaint   Patient presents with   ??? Follow Up Chronic Condition     Pt is fasting.    ??? Medication Refill     Pantoprazole    ??? Hypertension   ??? Cholesterol Problem   ??? Referral Request     Pt needs to make cardiology appt for f/u- printed out older referral for pt to have number. Pt would like dermatology referral.     Visit Vitals  BP 122/78 (BP 1 Location: Left arm, BP Patient Position: Sitting)   Pulse 70   Temp 98.4 ??F (36.9 ??C) (Skin)   Resp 16   Ht 5\' 7"  (1.702 m)   Wt 221 lb 1.6 oz (100.3 kg)   SpO2 97%   BMI 34.63 kg/m??     Social History     Tobacco Use   ??? Smoking status: Current Every Day Smoker     Packs/day: 1.00     Years: 38.00     Pack years: 38.00   ??? Smokeless tobacco: Never Used   Substance Use Topics   ??? Alcohol use: Yes     Comment: less than once a week, about once a month   ??? Drug use: Never     Current Outpatient Medications   Medication Sig Dispense Refill   ??? pantoprazole (PROTONIX) 40 mg tablet Take 40 mg by mouth daily.     ??? pantoprazole (PROTONIX) 40 mg tablet Take 1 Tab by mouth daily. 90 Tab 1   ??? buPROPion XL (WELLBUTRIN XL) 300 mg XL tablet Take 1 Tab by mouth every morning. 30 Tab 3   ??? atorvastatin (LIPITOR) 80 mg tablet Take 1 Tab by mouth daily. 90 Tab 1   ??? clopidogreL (PLAVIX) 75 mg tab Take 1 Tab by mouth daily. 90 Tab 1   ??? nitroglycerin (NITROSTAT) 0.4 mg SL tablet 1 Tab by SubLINGual route every five (5) minutes as needed for Chest Pain. Up to 3 doses. 1 Bottle 0   ??? loratadine (CLARITIN) 10 mg tablet Take 1 Tab by mouth daily. 30 Tab 2   ??? carvedilol (COREG) 3.125 mg tablet Take 1 Tab by mouth two (2) times daily (with meals). 180 Tab 1   ??? isosorbide mononitrate ER (IMDUR) 30 mg tablet Take 1 Tab by mouth daily. 90 Tab 1   ??? lisinopril (PRINIVIL, ZESTRIL) 5 mg tablet Take 1 Tab by mouth daily. 90 Tab 1     No  Known Allergies  Health Maintenance Topics with due status: Overdue       Topic Date Due    Hepatitis C Screening Jan 07, 1966    Pneumococcal 0-64 years 12/27/1969    DTaP/Tdap/Td series 12/27/1984    Shingrix Vaccine Age 86> 12/27/2013    FOBT Q1Y Age 86-75 12/27/2013       HPI   History of Present Illness  WOULD LIKE TO TRY STRONGER MED TO STOP SMOKING    Follow Up Chronic Condition   The history is provided by the patient. The current episode started more than 1 week ago. The problem has been gradually improving. Pertinent negatives include no chest pain, no abdominal pain, no headaches and no shortness of breath.   Medication Refill   This is a chronic problem. The current episode started more than 1 week ago. The  problem occurs constantly. The problem has not changed since onset.Pertinent negatives include no chest pain, no abdominal pain, no headaches and no shortness of breath.   Hypertension    This is a chronic problem. The current episode started more than 1 week ago. Pertinent negatives include no chest pain, no orthopnea, no palpitations, no headaches, no peripheral edema, no dizziness, no shortness of breath, no nausea and no vomiting.   Cholesterol Problem   This is a chronic problem. The current episode started more than 1 week ago. Pertinent negatives include no chest pain, no abdominal pain, no headaches and no shortness of breath.   Referral Request   This is a new (REFERRAL TO DERM FOR LESION SCROTUM) problem. Pertinent negatives include no chest pain, no abdominal pain, no headaches and no shortness of breath.       Reviewed pertinent past medical history, surgical history, social history, and family history as noted in patient record.     ROS  Review of Systems   Constitutional: Negative for fever.   Respiratory: Negative for shortness of breath.    Cardiovascular: Negative for chest pain, palpitations, orthopnea and leg swelling.   Gastrointestinal: Negative for abdominal pain, diarrhea, nausea  and vomiting.   Neurological: Negative for dizziness and headaches.       Physical Exam  Vitals signs and nursing note reviewed.   HENT:      Head: Normocephalic and atraumatic.   Eyes:      Conjunctiva/sclera: Conjunctivae normal.   Neck:      Musculoskeletal: Neck supple.   Cardiovascular:      Rate and Rhythm: Normal rate and regular rhythm.      Heart sounds: Normal heart sounds.   Pulmonary:      Effort: Pulmonary effort is normal.      Breath sounds: Normal breath sounds.   Neurological:      Mental Status: He is alert.   Psychiatric:         Mood and Affect: Affect normal.         Lab Results  No results found for this or any previous visit (from the past 8 hour(s)).    Assessment & Plan  Michael RuizJohn Dacus presents for a chronic care visit. Pre-visit preparations were complete prior to appointment. Barriers to treatment goals include: none  Self-management abilities include: diet, exercise, attempts to manage weight, checks blood sugar, checks blood pressure, keeps logs, smoking cessation    Diagnoses and all orders for this visit:    1. Heartburn  -     pantoprazole (PROTONIX) 40 mg tablet; Take 1 Tab by mouth daily.    2. Hyperlipidemia, unspecified hyperlipidemia type  -     LIPID PANEL    3. Essential hypertension  -     CBC WITH AUTOMATED DIFF  -     METABOLIC PANEL, COMPREHENSIVE  -     AMB POC EKG ROUTINE W/ 12 LEADS, INTER & REP-SINUS RHYTHM OLD AS INFARCT    4. Malaise  -     TSH 3RD GENERATION  -     TESTOSTERONE, TOTAL, ADULT MALE    5. Abnormal glucose  -     HEMOGLOBIN A1C WITH EAG    6. Screening for prostate cancer  -     PSA, DIAGNOSTIC (PROSTATE SPECIFIC AG)    7. Skin lesion of scrotum-WARTY LESION SCROTUM-     REFERRAL TO DERMATOLOGY    8. Encounter for smoking cessation counseling  -  buPROPion XL (WELLBUTRIN XL) 300 mg XL tablet; Take 1 Tab by mouth every morning.    ABNORMAL EKG---HE WILL MAKE REAPPT WITH CARDIOLOGY ALREADY ESTABLISHED    Patient and/or caretaker counseled on the  importance of balanced diet, exercise, weight management, and medication adherence. Addressed any identified treatment barriers. Care plans, self-management plans, and any necessary patient education handouts and self-management tools provided.    Follow-up and Dispositions    ?? Return in about 3 months (around 09/29/2018).           Vivianne SpenceMichael C Demitra Danley, MD

## 2018-06-29 NOTE — Progress Notes (Signed)
[facility]  [address]  [address]  [place], [place] [id number]  [phone]    Michael MinsJohn Haney, DOB: 07-26-1965, AGE: 53 y.o.  Chief Complaint   Patient presents with   ??? Follow Up Chronic Condition     Pt is fasting.    ??? Medication Refill     Pantoprazole    ??? Hypertension   ??? Cholesterol Problem   ??? Referral Request     Pt needs to make cardiology appt for f/u- printed out older referral for pt to have number. Pt would like dermatology referral.     Visit Vitals  BP 122/78 (BP 1 Location: Left arm, BP Patient Position: Sitting)   Pulse 70   Temp 98.4 ??F (36.9 ??C) (Skin)   Resp 16   Ht 5\' 7"  (1.702 m)   Wt 221 lb 1.6 oz (100.3 kg)   SpO2 97%   BMI 34.63 kg/m??     Social History     Tobacco Use   ??? Smoking status: Current Every Day Smoker     Packs/day: 1.00     Years: 38.00     Pack years: 38.00   ??? Smokeless tobacco: Never Used   Substance Use Topics   ??? Alcohol use: Yes     Comment: less than once a week, about once a month   ??? Drug use: Never     Current Outpatient Medications   Medication Sig Dispense Refill   ??? pantoprazole (PROTONIX) 40 mg tablet Take 40 mg by mouth daily.     ??? pantoprazole (PROTONIX) 40 mg tablet Take 1 Tab by mouth daily. 90 Tab 1   ??? buPROPion XL (WELLBUTRIN XL) 300 mg XL tablet Take 1 Tab by mouth every morning. 30 Tab 3   ??? atorvastatin (LIPITOR) 80 mg tablet Take 1 Tab by mouth daily. 90 Tab 1   ??? clopidogreL (PLAVIX) 75 mg tab Take 1 Tab by mouth daily. 90 Tab 1   ??? nitroglycerin (NITROSTAT) 0.4 mg SL tablet 1 Tab by SubLINGual route every five (5) minutes as needed for Chest Pain. Up to 3 doses. 1 Bottle 0   ??? loratadine (CLARITIN) 10 mg tablet Take 1 Tab by mouth daily. 30 Tab 2   ??? carvedilol (COREG) 3.125 mg tablet Take 1 Tab by mouth two (2) times daily (with meals). 180 Tab 1   ??? isosorbide mononitrate ER (IMDUR) 30 mg tablet Take 1 Tab by mouth daily. 90 Tab 1   ??? lisinopril (PRINIVIL, ZESTRIL) 5 mg tablet Take 1 Tab by mouth daily. 90 Tab 1      No Known Allergies  Health Maintenance Topics with due status: Overdue       Topic Date Due    Hepatitis C Screening 07-26-1965    Pneumococcal 0-64 years 12/27/1969    DTaP/Tdap/Td series 12/27/1984    Shingrix Vaccine Age 55> 12/27/2013    FOBT Q1Y Age 55-75 12/27/2013       HPI   History of Present Illness  WOULD LIKE TO TRY STRONGER MED TO STOP SMOKING    Follow Up Chronic Condition   The history is provided by the patient. The current episode started more than 1 week ago. The problem has been gradually improving. Pertinent negatives include no chest pain, no abdominal pain, no headaches and no shortness of breath.   Medication Refill   This is a chronic problem. The current episode started more than 1 week ago. The  problem occurs constantly. The problem has not changed since onset.Pertinent negatives include no chest pain, no abdominal pain, no headaches and no shortness of breath.   Hypertension    This is a chronic problem. The current episode started more than 1 week ago. Pertinent negatives include no chest pain, no orthopnea, no palpitations, no headaches, no peripheral edema, no dizziness, no shortness of breath, no nausea and no vomiting.   Cholesterol Problem   This is a chronic problem. The current episode started more than 1 week ago. Pertinent negatives include no chest pain, no abdominal pain, no headaches and no shortness of breath.   Referral Request   This is a new (REFERRAL TO DERM FOR LESION SCROTUM) problem. Pertinent negatives include no chest pain, no abdominal pain, no headaches and no shortness of breath.       Reviewed pertinent past medical history, surgical history, social history, and family history as noted in patient record.     ROS  Review of Systems   Constitutional: Negative for fever.   Respiratory: Negative for shortness of breath.    Cardiovascular: Negative for chest pain, palpitations, orthopnea and leg swelling.    Gastrointestinal: Negative for abdominal pain, diarrhea, nausea and vomiting.   Neurological: Negative for dizziness and headaches.       Physical Exam  Vitals signs and nursing note reviewed.   HENT:      Head: Normocephalic and atraumatic.   Eyes:      Conjunctiva/sclera: Conjunctivae normal.   Neck:      Musculoskeletal: Neck supple.   Cardiovascular:      Rate and Rhythm: Normal rate and regular rhythm.      Heart sounds: Normal heart sounds.   Pulmonary:      Effort: Pulmonary effort is normal.      Breath sounds: Normal breath sounds.   Neurological:      Mental Status: He is alert.   Psychiatric:         Mood and Affect: Affect normal.         Lab Results  No results found for this or any previous visit (from the past 8 hour(s)).    Assessment & Plan  Michael Haney presents for a chronic care visit. Pre-visit preparations were complete prior to appointment. Barriers to treatment goals include: none  Self-management abilities include: diet, exercise, attempts to manage weight, checks blood sugar, checks blood pressure, keeps logs, smoking cessation    Diagnoses and all orders for this visit:    1. Heartburn  -     pantoprazole (PROTONIX) 40 mg tablet; Take 1 Tab by mouth daily.    2. Hyperlipidemia, unspecified hyperlipidemia type  -     LIPID PANEL    3. Essential hypertension  -     CBC WITH AUTOMATED DIFF  -     METABOLIC PANEL, COMPREHENSIVE  -     AMB POC EKG ROUTINE W/ 12 LEADS, INTER & REP-SINUS RHYTHM OLD AS INFARCT    4. Malaise  -     TSH 3RD GENERATION  -     TESTOSTERONE, TOTAL, ADULT MALE    5. Abnormal glucose  -     HEMOGLOBIN A1C WITH EAG    6. Screening for prostate cancer  -     PSA, DIAGNOSTIC (PROSTATE SPECIFIC AG)    7. Skin lesion of scrotum-WARTY LESION SCROTUM-     REFERRAL TO DERMATOLOGY    8. Encounter for smoking cessation counseling  -  buPROPion XL (WELLBUTRIN XL) 300 mg XL tablet; Take 1 Tab by mouth every morning.     ABNORMAL EKG---HE WILL MAKE REAPPT WITH CARDIOLOGY ALREADY ESTABLISHED    Patient and/or caretaker counseled on the importance of balanced diet, exercise, weight management, and medication adherence. Addressed any identified treatment barriers. Care plans, self-management plans, and any necessary patient education handouts and self-management tools provided.    Follow-up and Dispositions    ?? Return in about 3 months (around 09/29/2018).           Vivianne Spence, MD

## 2018-06-30 LAB — PSA, DIAGNOSTIC (PROSTATE SPECIFIC AG): Prostate Specific Ag: 2.6 ng/mL (ref 0.0–4.0)

## 2018-06-30 LAB — CBC WITH AUTOMATED DIFF
ABS. BASOPHILS: 0.1 10*3/uL (ref 0.0–0.2)
ABS. EOSINOPHILS: 0.1 10*3/uL (ref 0.0–0.4)
ABS. IMM. GRANS.: 0 10*3/uL (ref 0.0–0.1)
ABS. MONOCYTES: 0.6 10*3/uL (ref 0.1–0.9)
ABS. NEUTROPHILS: 4.3 10*3/uL (ref 1.4–7.0)
Abs Lymphocytes: 1.3 10*3/uL (ref 0.7–3.1)
BASOPHILS: 1 %
EOSINOPHILS: 2 %
HCT: 54.4 % — ABNORMAL HIGH (ref 37.5–51.0)
HGB: 17.5 g/dL (ref 13.0–17.7)
IMMATURE GRANULOCYTES: 0 %
Lymphocytes: 21 %
MCH: 29.9 pg (ref 26.6–33.0)
MCHC: 32.2 g/dL (ref 31.5–35.7)
MCV: 93 fL (ref 79–97)
MONOCYTES: 9 %
NEUTROPHILS: 67 %
PLATELET: 200 10*3/uL (ref 150–450)
RBC: 5.85 x10E6/uL — ABNORMAL HIGH (ref 4.14–5.80)
RDW: 12.2 % (ref 11.6–15.4)
WBC: 6.5 10*3/uL (ref 3.4–10.8)

## 2018-06-30 LAB — METABOLIC PANEL, COMPREHENSIVE
A-G Ratio: 2.2 (ref 1.2–2.2)
ALT (SGPT): 34 IU/L (ref 0–44)
AST (SGOT): 25 IU/L (ref 0–40)
Albumin: 4.6 g/dL (ref 3.8–4.9)
Alk. phosphatase: 85 IU/L (ref 39–117)
BUN/Creatinine ratio: 16 (ref 9–20)
BUN: 12 mg/dL (ref 6–24)
Bilirubin, total: 0.7 mg/dL (ref 0.0–1.2)
CO2: 22 mmol/L (ref 20–29)
Calcium: 9.2 mg/dL (ref 8.7–10.2)
Chloride: 108 mmol/L — ABNORMAL HIGH (ref 96–106)
Creatinine: 0.74 mg/dL — ABNORMAL LOW (ref 0.76–1.27)
GFR est AA: 121 mL/min/{1.73_m2} (ref >59)
GFR est non-AA: 105 mL/min/{1.73_m2} (ref >59)
GLOBULIN, TOTAL: 2.1 g/dL (ref 1.5–4.5)
Glucose: 96 mg/dL (ref 65–99)
Potassium: 4.2 mmol/L (ref 3.5–5.2)
Protein, total: 6.7 g/dL (ref 6.0–8.5)
Sodium: 142 mmol/L (ref 134–144)

## 2018-06-30 LAB — HEMOGLOBIN A1C WITH EAG
Estimated average glucose: 105 mg/dL
Hemoglobin A1c: 5.3 % (ref 4.8–5.6)

## 2018-06-30 LAB — LIPID PANEL
Cholesterol, Total: 112 mg/dL (ref 100–199)
Cholesterol, total: 112 mg/dL (ref 100–199)
HDL Cholesterol: 38 mg/dL — ABNORMAL LOW (ref >39)
HDL: 38 mg/dL — ABNORMAL LOW (ref >39)
LDL Calculated: 58 mg/dL (ref 0–99)
LDL, calculated: 58 mg/dL (ref 0–99)
Triglyceride: 80 mg/dL (ref 0–149)
Triglycerides: 80 mg/dL (ref 0–149)
VLDL Cholesterol Calculated: 16 mg/dL (ref 5–40)
VLDL, calculated: 16 mg/dL (ref 5–40)

## 2018-06-30 LAB — TSH 3RD GENERATION
TSH: 0.534 u[IU]/mL (ref 0.450–4.500)
TSH: 0.534 u[IU]/mL (ref 0.450–4.500)

## 2018-06-30 LAB — TESTOSTERONE, TOTAL, ADULT MALE: Testosterone: 449 ng/dL (ref 264–916)

## 2018-06-30 LAB — CBC WITH AUTO DIFFERENTIAL
Basophils %: 1 %
Basophils Absolute: 0.1 10*3/uL (ref 0.0–0.2)
Eosinophils %: 2 %
Eosinophils Absolute: 0.1 10*3/uL (ref 0.0–0.4)
Granulocyte Absolute Count: 0 10*3/uL (ref 0.0–0.1)
Hematocrit: 54.4 % — ABNORMAL HIGH (ref 37.5–51.0)
Hemoglobin: 17.5 g/dL (ref 13.0–17.7)
Immature Granulocytes %: 0 %
Lymphocytes %: 21 %
Lymphocytes Absolute: 1.3 10*3/uL (ref 0.7–3.1)
MCH: 29.9 pg (ref 26.6–33.0)
MCHC: 32.2 g/dL (ref 31.5–35.7)
MCV: 93 fL (ref 79–97)
Monocytes %: 9 %
Monocytes Absolute: 0.6 10*3/uL (ref 0.1–0.9)
Neutrophils %: 67 %
Neutrophils Absolute: 4.3 10*3/uL (ref 1.4–7.0)
Platelets: 200 10*3/uL (ref 150–450)
RBC: 5.85 x10E6/uL — ABNORMAL HIGH (ref 4.14–5.80)
RDW: 12.2 % (ref 11.6–15.4)
WBC: 6.5 10*3/uL (ref 3.4–10.8)

## 2018-06-30 LAB — COMPREHENSIVE METABOLIC PANEL
ALT: 34 IU/L (ref 0–44)
AST: 25 IU/L (ref 0–40)
Albumin/Globulin Ratio: 2.2 NA (ref 1.2–2.2)
Albumin: 4.6 g/dL (ref 3.8–4.9)
Alkaline Phosphatase: 85 IU/L (ref 39–117)
BUN/Creatinine Ratio: 16 NA (ref 9–20)
BUN: 12 mg/dL (ref 6–24)
CO2: 22 mmol/L (ref 20–29)
Calcium: 9.2 mg/dL (ref 8.7–10.2)
Chloride: 108 mmol/L — ABNORMAL HIGH (ref 96–106)
Creatinine: 0.74 mg/dL — ABNORMAL LOW (ref 0.76–1.27)
GFR African American: 121 mL/min/{1.73_m2} (ref >59)
Globulin, Total: 2.1 g/dL (ref 1.5–4.5)
Glucose: 96 mg/dL (ref 65–99)
Potassium: 4.2 mmol/L (ref 3.5–5.2)
Sodium: 142 mmol/L (ref 134–144)
Total Bilirubin: 0.7 mg/dL (ref 0.0–1.2)
Total Protein: 6.7 g/dL (ref 6.0–8.5)
eGFR NON-AA: 105 mL/min/{1.73_m2} (ref >59)

## 2018-06-30 LAB — TESTOSTERONE: Testosterone: 449 ng/dL (ref 264–916)

## 2018-06-30 LAB — PSA PROSTATIC SPECIFIC ANTIGEN: PSA: 2.6 ng/mL (ref 0.0–4.0)

## 2018-06-30 LAB — HEMOGLOBIN A1C W/EAG
Estimated Avg Glucose: 105 mg/dL
Hemoglobin A1C: 5.3 % (ref 4.8–5.6)

## 2018-09-01 ENCOUNTER — Encounter

## 2018-09-01 MED ORDER — CARVEDILOL 3.125 MG TAB
3.125 mg | ORAL_TABLET | ORAL | 0 refills | Status: DC
Start: 2018-09-01 — End: 2018-10-05

## 2018-09-01 NOTE — Telephone Encounter (Signed)
MAKE SURE FOLLOW UP APPT 3 MONTHS

## 2018-10-05 ENCOUNTER — Ambulatory Visit: Attending: Family Medicine | Primary: Family Medicine

## 2018-10-05 ENCOUNTER — Ambulatory Visit
Admit: 2018-10-05 | Discharge: 2018-10-05 | Payer: PRIVATE HEALTH INSURANCE | Attending: Family Medicine | Primary: Family Medicine

## 2018-10-05 DIAGNOSIS — R0789 Other chest pain: Secondary | ICD-10-CM

## 2018-10-05 MED ORDER — ATORVASTATIN 80 MG TAB
80 mg | ORAL_TABLET | Freq: Every day | ORAL | 1 refills | Status: AC
Start: 2018-10-05 — End: ?

## 2018-10-05 MED ORDER — ISOSORBIDE MONONITRATE SR 30 MG 24 HR TAB
30 mg | ORAL_TABLET | Freq: Every day | ORAL | 1 refills | Status: AC
Start: 2018-10-05 — End: ?

## 2018-10-05 MED ORDER — CARVEDILOL 3.125 MG TAB
3.125 mg | ORAL_TABLET | ORAL | 0 refills | Status: DC
Start: 2018-10-05 — End: 2019-04-02

## 2018-10-05 MED ORDER — CLOPIDOGREL 75 MG TAB
75 mg | ORAL_TABLET | Freq: Every day | ORAL | 1 refills | Status: AC
Start: 2018-10-05 — End: ?

## 2018-10-05 MED ORDER — LISINOPRIL 5 MG TAB
5 mg | ORAL_TABLET | Freq: Every day | ORAL | 1 refills | Status: AC
Start: 2018-10-05 — End: ?

## 2018-10-05 NOTE — Progress Notes (Signed)
Notify that labs are normal

## 2018-10-05 NOTE — Progress Notes (Signed)
Called and notified pt

## 2018-10-05 NOTE — Progress Notes (Signed)
[facility]  [address]  [address]  [place], [place] [id number]  [phone]    Michael MinsJohn Haney, DOB: 1965-02-04, AGE: 53 y.o.  Chief Complaint   Patient presents with   ??? Follow Up Chronic Condition     3 month f/u.       Visit Vitals  BP 138/82   Pulse 72   Temp 97.1 ??F (36.2 ??C) (Skin)   Ht 5\' 7"  (1.702 m)   Wt 219 lb (99.3 kg)   SpO2 97%   BMI 34.30 kg/m??     Social History     Tobacco Use   ??? Smoking status: Current Every Day Smoker     Packs/day: 1.00     Years: 38.00     Pack years: 38.00   ??? Smokeless tobacco: Never Used   Substance Use Topics   ??? Alcohol use: Yes     Comment: less than once a week, about once a month   ??? Drug use: Never     Current Outpatient Medications   Medication Sig Dispense Refill   ??? atorvastatin (LIPITOR) 80 mg tablet Take 1 Tab by mouth daily. 90 Tab 1   ??? clopidogreL (PLAVIX) 75 mg tab Take 1 Tab by mouth daily. 90 Tab 1   ??? isosorbide mononitrate ER (IMDUR) 30 mg tablet Take 1 Tab by mouth daily. 90 Tab 1   ??? lisinopriL (PRINIVIL, ZESTRIL) 5 mg tablet Take 1 Tab by mouth daily. 90 Tab 1   ??? carvediloL (COREG) 3.125 mg tablet TAKE ONE TABLET BY MOUTH TWICE A DAY WITH MEALS 180 Tab 0   ??? pantoprazole (PROTONIX) 40 mg tablet Take 1 Tab by mouth daily. 90 Tab 1   ??? nitroglycerin (NITROSTAT) 0.4 mg SL tablet 1 Tab by SubLINGual route every five (5) minutes as needed for Chest Pain. Up to 3 doses. 1 Bottle 0   ??? loratadine (CLARITIN) 10 mg tablet Take 1 Tab by mouth daily. 30 Tab 2   ??? pantoprazole (PROTONIX) 40 mg tablet Take 40 mg by mouth daily.     ??? buPROPion XL (WELLBUTRIN XL) 300 mg XL tablet Take 1 Tab by mouth every morning. 30 Tab 3     Not on File  Health Maintenance Topics with due status: Overdue       Topic Date Due    Hepatitis C Screening 1965-02-04    Pneumococcal 0-64 years 12/27/1969    DTaP/Tdap/Td series 12/27/1984    Shingrix Vaccine Age 54> 12/27/2013    FOBT Q1Y Age 54-75 12/27/2013    Flu Vaccine 09/19/2018       HPI   History of  Present Illness  Follow Up Chronic Condition   Associated symptoms include chest pain. Pertinent negatives include no abdominal pain and no shortness of breath.   Chest Pain (Angina)    This is a new (HAS HAD HEARTBURN ON AND OFF LAST FEWDAYS) problem. The current episode started more than 1 week ago. The problem has been gradually improving. The pain is at a severity of 1/10. The pain is mild. Pertinent negatives include no abdominal pain, no diaphoresis, no dizziness, no exertional chest pressure, no fever, no lower extremity edema, no malaise/fatigue, no nausea, no orthopnea, no palpitations, no shortness of breath, no vomiting and no weakness.   Hypertension    This is a chronic problem. The current episode started more than 1 week ago. The problem has been gradually improving. Associated symptoms include chest  pain. Pertinent negatives include no orthopnea, no palpitations, no malaise/fatigue, no peripheral edema, no dizziness, no shortness of breath, no nausea and no vomiting.   Cholesterol Problem   This is a chronic problem. The current episode started more than 1 week ago. The problem has been gradually improving. Associated symptoms include chest pain. Pertinent negatives include no abdominal pain and no shortness of breath.       Reviewed pertinent past medical history, surgical history, social history, and family history as noted in patient record.     ROS  Review of Systems   Constitutional: Negative for diaphoresis, fever and malaise/fatigue.   Respiratory: Negative for shortness of breath.    Cardiovascular: Positive for chest pain. Negative for palpitations, orthopnea and leg swelling.   Gastrointestinal: Negative for abdominal pain, diarrhea, nausea and vomiting.   Neurological: Negative for dizziness and weakness.       Physical Exam  Vitals signs and nursing note reviewed.   HENT:      Head: Normocephalic and atraumatic.   Eyes:      Conjunctiva/sclera: Conjunctivae normal.   Neck:       Musculoskeletal: Neck supple.   Cardiovascular:      Rate and Rhythm: Normal rate and regular rhythm.      Heart sounds: Normal heart sounds.   Pulmonary:      Effort: Pulmonary effort is normal.      Breath sounds: Normal breath sounds.   Neurological:      Mental Status: He is alert.   Psychiatric:         Mood and Affect: Affect normal.         Lab Results  No results found for this or any previous visit (from the past 8 hour(s)).    Assessment & Plan  Michael Haney presents for a chronic care visit. Pre-visit preparations were complete prior to appointment. Barriers to treatment goals include: none  Self-management abilities include: diet, exercise, attempts to manage weight, checks blood sugar, checks blood pressure, keeps logs, smoking cessation    Diagnoses and all orders for this visit:    1. Other chest pain  -     AMB POC EKG ROUTINE W/ 12 LEADS, INTER & REP--SINUS RHYTHM NO ACUTE CHANGE    2. Other hyperlipidemia  -     atorvastatin (LIPITOR) 80 mg tablet; Take 1 Tab by mouth daily.  -     LIPID PANEL    3. Coronary artery disease due to lipid rich plaque  -     clopidogreL (PLAVIX) 75 mg tab; Take 1 Tab by mouth daily.  -     AMB POC EKG ROUTINE W/ 12 LEADS, INTER & REP    4. Essential hypertension  -     isosorbide mononitrate ER (IMDUR) 30 mg tablet; Take 1 Tab by mouth daily.  -     lisinopriL (PRINIVIL, ZESTRIL) 5 mg tablet; Take 1 Tab by mouth daily.  -     carvediloL (COREG) 3.125 mg tablet; TAKE ONE TABLET BY MOUTH TWICE A DAY WITH MEALS  -     CBC WITH AUTOMATED DIFF  -     METABOLIC PANEL, COMPREHENSIVE    5. Encounter for hepatitis C screening test for low risk patient  -     HEPATITIS C AB    6. Screen for colon cancer  -     REFERRAL TO GASTROENTEROLOGY    7. Pain in both feet  -  REFERRAL TO PODIATRY        Patient and/or caretaker counseled on the importance of balanced diet, exercise, weight management, and medication adherence. Addressed any identified treatment barriers. Care plans,  self-management plans, and any necessary patient education handouts and self-management tools provided.    Follow-up and Dispositions    ?? Return in about 6 weeks (around 11/16/2018).           Morene Crocker, MD

## 2018-10-05 NOTE — Progress Notes (Signed)
[facility]  [address] Thelma Barge  [address]  [place], [place] [address]  [phone]    Michael Haney, DOB: 1965/10/26, AGE: 53 y.o.  Chief Complaint   Patient presents with   ??? Follow Up Chronic Condition     3 month f/u.       Visit Vitals  BP 138/82   Pulse 72   Temp 97.1 ??F (36.2 ??C) (Skin)   Ht 5\' 7"  (1.702 m)   Wt 219 lb (99.3 kg)   SpO2 97%   BMI 34.30 kg/m??     Social History     Tobacco Use   ??? Smoking status: Current Every Day Smoker     Packs/day: 1.00     Years: 38.00     Pack years: 38.00   ??? Smokeless tobacco: Never Used   Substance Use Topics   ??? Alcohol use: Yes     Comment: less than once a week, about once a month   ??? Drug use: Never     Current Outpatient Medications   Medication Sig Dispense Refill   ??? atorvastatin (LIPITOR) 80 mg tablet Take 1 Tab by mouth daily. 90 Tab 1   ??? clopidogreL (PLAVIX) 75 mg tab Take 1 Tab by mouth daily. 90 Tab 1   ??? isosorbide mononitrate ER (IMDUR) 30 mg tablet Take 1 Tab by mouth daily. 90 Tab 1   ??? lisinopriL (PRINIVIL, ZESTRIL) 5 mg tablet Take 1 Tab by mouth daily. 90 Tab 1   ??? carvediloL (COREG) 3.125 mg tablet TAKE ONE TABLET BY MOUTH TWICE A DAY WITH MEALS 180 Tab 0   ??? pantoprazole (PROTONIX) 40 mg tablet Take 1 Tab by mouth daily. 90 Tab 1   ??? nitroglycerin (NITROSTAT) 0.4 mg SL tablet 1 Tab by SubLINGual route every five (5) minutes as needed for Chest Pain. Up to 3 doses. 1 Bottle 0   ??? loratadine (CLARITIN) 10 mg tablet Take 1 Tab by mouth daily. 30 Tab 2   ??? pantoprazole (PROTONIX) 40 mg tablet Take 40 mg by mouth daily.     ??? buPROPion XL (WELLBUTRIN XL) 300 mg XL tablet Take 1 Tab by mouth every morning. 30 Tab 3     Not on File  Health Maintenance Topics with due status: Overdue       Topic Date Due    Hepatitis C Screening Mar 05, 1965    Pneumococcal 0-64 years 12/27/1969    DTaP/Tdap/Td series 12/27/1984    Shingrix Vaccine Age 62> 12/27/2013    FOBT Q1Y Age 62-75 12/27/2013    Flu Vaccine 09/19/2018       HPI    History of Present Illness  Follow Up Chronic Condition   Associated symptoms include chest pain. Pertinent negatives include no abdominal pain and no shortness of breath.   Chest Pain (Angina)    This is a new (HAS HAD HEARTBURN ON AND OFF LAST FEWDAYS) problem. The current episode started more than 1 week ago. The problem has been gradually improving. The pain is at a severity of 1/10. The pain is mild. Pertinent negatives include no abdominal pain, no diaphoresis, no dizziness, no exertional chest pressure, no fever, no lower extremity edema, no malaise/fatigue, no nausea, no orthopnea, no palpitations, no shortness of breath, no vomiting and no weakness.   Hypertension    This is a chronic problem. The current episode started more than 1 week ago. The problem has been gradually improving. Associated symptoms include chest  pain. Pertinent negatives include no orthopnea, no palpitations, no malaise/fatigue, no peripheral edema, no dizziness, no shortness of breath, no nausea and no vomiting.   Cholesterol Problem   This is a chronic problem. The current episode started more than 1 week ago. The problem has been gradually improving. Associated symptoms include chest pain. Pertinent negatives include no abdominal pain and no shortness of breath.       Reviewed pertinent past medical history, surgical history, social history, and family history as noted in patient record.     ROS  Review of Systems   Constitutional: Negative for diaphoresis, fever and malaise/fatigue.   Respiratory: Negative for shortness of breath.    Cardiovascular: Positive for chest pain. Negative for palpitations, orthopnea and leg swelling.   Gastrointestinal: Negative for abdominal pain, diarrhea, nausea and vomiting.   Neurological: Negative for dizziness and weakness.       Physical Exam  Vitals signs and nursing note reviewed.   HENT:      Head: Normocephalic and atraumatic.   Eyes:      Conjunctiva/sclera: Conjunctivae normal.   Neck:       Musculoskeletal: Neck supple.   Cardiovascular:      Rate and Rhythm: Normal rate and regular rhythm.      Heart sounds: Normal heart sounds.   Pulmonary:      Effort: Pulmonary effort is normal.      Breath sounds: Normal breath sounds.   Neurological:      Mental Status: He is alert.   Psychiatric:         Mood and Affect: Affect normal.         Lab Results  No results found for this or any previous visit (from the past 8 hour(s)).    Assessment & Plan  Michael RuizJohn Haney presents for a chronic care visit. Pre-visit preparations were complete prior to appointment. Barriers to treatment goals include: none  Self-management abilities include: diet, exercise, attempts to manage weight, checks blood sugar, checks blood pressure, keeps logs, smoking cessation    Diagnoses and all orders for this visit:    1. Other chest pain  -     AMB POC EKG ROUTINE W/ 12 LEADS, INTER & REP--SINUS RHYTHM NO ACUTE CHANGE    2. Other hyperlipidemia  -     atorvastatin (LIPITOR) 80 mg tablet; Take 1 Tab by mouth daily.  -     LIPID PANEL    3. Coronary artery disease due to lipid rich plaque  -     clopidogreL (PLAVIX) 75 mg tab; Take 1 Tab by mouth daily.  -     AMB POC EKG ROUTINE W/ 12 LEADS, INTER & REP    4. Essential hypertension  -     isosorbide mononitrate ER (IMDUR) 30 mg tablet; Take 1 Tab by mouth daily.  -     lisinopriL (PRINIVIL, ZESTRIL) 5 mg tablet; Take 1 Tab by mouth daily.  -     carvediloL (COREG) 3.125 mg tablet; TAKE ONE TABLET BY MOUTH TWICE A DAY WITH MEALS  -     CBC WITH AUTOMATED DIFF  -     METABOLIC PANEL, COMPREHENSIVE    5. Encounter for hepatitis C screening test for low risk patient  -     HEPATITIS C AB    6. Screen for colon cancer  -     REFERRAL TO GASTROENTEROLOGY    7. Pain in both feet  -  REFERRAL TO PODIATRY        Patient and/or caretaker counseled on the importance of balanced diet, exercise, weight management, and medication adherence. Addressed any  identified treatment barriers. Care plans, self-management plans, and any necessary patient education handouts and self-management tools provided.    Follow-up and Dispositions    ?? Return in about 6 weeks (around 11/16/2018).           Vivianne Spence, MD

## 2018-10-06 LAB — METABOLIC PANEL, COMPREHENSIVE
A-G Ratio: 1.7 (ref 1.2–2.2)
ALT (SGPT): 34 IU/L (ref 0–44)
AST (SGOT): 20 IU/L (ref 0–40)
Albumin: 4.2 g/dL (ref 3.8–4.9)
Alk. phosphatase: 94 IU/L (ref 39–117)
BUN/Creatinine ratio: 16 (ref 9–20)
BUN: 16 mg/dL (ref 6–24)
Bilirubin, total: 0.6 mg/dL (ref 0.0–1.2)
CO2: 24 mmol/L (ref 20–29)
Calcium: 9.6 mg/dL (ref 8.7–10.2)
Chloride: 102 mmol/L (ref 96–106)
Creatinine: 0.99 mg/dL (ref 0.76–1.27)
GFR est AA: 99 mL/min/{1.73_m2} (ref >59)
GFR est non-AA: 86 mL/min/{1.73_m2} (ref >59)
GLOBULIN, TOTAL: 2.5 g/dL (ref 1.5–4.5)
Glucose: 88 mg/dL (ref 65–99)
Potassium: 4.5 mmol/L (ref 3.5–5.2)
Protein, total: 6.7 g/dL (ref 6.0–8.5)
Sodium: 138 mmol/L (ref 134–144)

## 2018-10-06 LAB — CBC WITH AUTOMATED DIFF
ABS. BASOPHILS: 0.1 10*3/uL (ref 0.0–0.2)
ABS. EOSINOPHILS: 0.1 10*3/uL (ref 0.0–0.4)
ABS. IMM. GRANS.: 0 10*3/uL (ref 0.0–0.1)
ABS. MONOCYTES: 0.7 10*3/uL (ref 0.1–0.9)
ABS. NEUTROPHILS: 6.5 10*3/uL (ref 1.4–7.0)
Abs Lymphocytes: 1.8 10*3/uL (ref 0.7–3.1)
BASOPHILS: 1 %
EOSINOPHILS: 1 %
HCT: 51.7 % — ABNORMAL HIGH (ref 37.5–51.0)
HGB: 17.3 g/dL (ref 13.0–17.7)
IMMATURE GRANULOCYTES: 0 %
Lymphocytes: 19 %
MCH: 29.9 pg (ref 26.6–33.0)
MCHC: 33.5 g/dL (ref 31.5–35.7)
MCV: 89 fL (ref 79–97)
MONOCYTES: 8 %
NEUTROPHILS: 71 %
PLATELET: 236 10*3/uL (ref 150–450)
RBC: 5.78 x10E6/uL (ref 4.14–5.80)
RDW: 11.8 % (ref 11.6–15.4)
WBC: 9.2 10*3/uL (ref 3.4–10.8)

## 2018-10-06 LAB — LIPID PANEL
Cholesterol, Total: 134 mg/dL (ref 100–199)
Cholesterol, total: 134 mg/dL (ref 100–199)
HDL Cholesterol: 39 mg/dL — ABNORMAL LOW (ref >39)
HDL: 39 mg/dL — ABNORMAL LOW (ref >39)
LDL Calculated: 73 mg/dL (ref 0–99)
LDL, calculated: 73 mg/dL (ref 0–99)
Triglyceride: 122 mg/dL (ref 0–149)
Triglycerides: 122 mg/dL (ref 0–149)
VLDL, calculated: 22 mg/dL (ref 5–40)
VLDL: 22 mg/dL (ref 5–40)

## 2018-10-06 LAB — HEPATITIS C AB: Hep C Virus Ab: <0.1 s/co ratio (ref 0.0–0.9)

## 2018-10-06 LAB — COMPREHENSIVE METABOLIC PANEL
ALT: 34 IU/L (ref 0–44)
AST: 20 IU/L (ref 0–40)
Albumin/Globulin Ratio: 1.7 NA (ref 1.2–2.2)
Albumin: 4.2 g/dL (ref 3.8–4.9)
Alkaline Phosphatase: 94 IU/L (ref 39–117)
BUN/Creatinine Ratio: 16 NA (ref 9–20)
BUN: 16 mg/dL (ref 6–24)
CO2: 24 mmol/L (ref 20–29)
Calcium: 9.6 mg/dL (ref 8.7–10.2)
Chloride: 102 mmol/L (ref 96–106)
Creatinine: 0.99 mg/dL (ref 0.76–1.27)
GFR African American: 99 mL/min/{1.73_m2} (ref >59)
Globulin, Total: 2.5 g/dL (ref 1.5–4.5)
Glucose: 88 mg/dL (ref 65–99)
Potassium: 4.5 mmol/L (ref 3.5–5.2)
Sodium: 138 mmol/L (ref 134–144)
Total Bilirubin: 0.6 mg/dL (ref 0.0–1.2)
Total Protein: 6.7 g/dL (ref 6.0–8.5)
eGFR NON-AA: 86 mL/min/{1.73_m2} (ref >59)

## 2018-10-06 LAB — CBC WITH AUTO DIFFERENTIAL
Basophils %: 1 %
Basophils Absolute: 0.1 10*3/uL (ref 0.0–0.2)
Eosinophils %: 1 %
Eosinophils Absolute: 0.1 10*3/uL (ref 0.0–0.4)
Granulocyte Absolute Count: 0 10*3/uL (ref 0.0–0.1)
Hematocrit: 51.7 % — ABNORMAL HIGH (ref 37.5–51.0)
Hemoglobin: 17.3 g/dL (ref 13.0–17.7)
Immature Granulocytes %: 0 %
Lymphocytes %: 19 %
Lymphocytes Absolute: 1.8 10*3/uL (ref 0.7–3.1)
MCH: 29.9 pg (ref 26.6–33.0)
MCHC: 33.5 g/dL (ref 31.5–35.7)
MCV: 89 fL (ref 79–97)
Monocytes %: 8 %
Monocytes Absolute: 0.7 10*3/uL (ref 0.1–0.9)
Neutrophils %: 71 %
Neutrophils Absolute: 6.5 10*3/uL (ref 1.4–7.0)
Platelets: 236 10*3/uL (ref 150–450)
RBC: 5.78 x10E6/uL (ref 4.14–5.80)
RDW: 11.8 % (ref 11.6–15.4)
WBC: 9.2 10*3/uL (ref 3.4–10.8)

## 2018-10-06 LAB — HEPATITIS C ANTIBODY: Hepatitis C Ab: <0.1 {s_co_ratio} (ref 0.0–0.9)

## 2018-10-26 ENCOUNTER — Ambulatory Visit: Attending: Internal Medicine | Primary: Family Medicine

## 2018-10-26 ENCOUNTER — Ambulatory Visit
Admit: 2018-10-26 | Discharge: 2018-10-26 | Payer: PRIVATE HEALTH INSURANCE | Attending: Internal Medicine | Primary: Family Medicine

## 2018-10-26 DIAGNOSIS — I2510 Atherosclerotic heart disease of native coronary artery without angina pectoris: Secondary | ICD-10-CM

## 2018-10-26 NOTE — Progress Notes (Signed)
Progress Notes by Abner Greenspan, MD at 10/26/18 0915                Author: Abner Greenspan, MD  Service: --  Author Type: Physician       Filed: 10/26/18 0933  Encounter Date: 10/26/2018  Status: Addendum          Editor: Abner Greenspan, MD (Physician)          Related Notes: Original Note by Abner Greenspan, MD (Physician) filed at 10/26/18 [place], [address]   [place] [address]   PHONE: [phone]      SUBJECTIVE:    Michael Haney is a 53 y.o.  male 1965/10/04            Chief Complaint       Patient presents with        ?  Coronary Artery Disease             yearly                  Coronary Artery Disease (yearly)      History of Present Illness:  The patient presented for consultation 11/08/2017. The patient presented for follow up  10/26/18.   Reflux sx no CP.  Still smoking.      Interval Hx:    Previous history of coronary artery disease, status post MI in 2014, recurrent cardiac catheterization.  The patient moved from [place] in 2019.  Previously a Production designer, theatre/television/film.  The patient has a previous history of tobacco abuse.  No symptoms  of chest discomfort at this time, still smoking.        Cardiac History:     1.  MI in 2014 in [place].  PCI to LAD, 3.5 x 18 Xience drug eluting stent.   2.  ECG 11/02/2017 - sinus rhythm with anterior septal infarct, age indeterminate.    3.  EKG 10/26/2018 Sinus  Rhythm Old anteroseptal infarct. ABNORMAL       Assessment and Plan:     1.  Reflux    ??  Not controlled    ??  Ref to GI    2.  Tobacco abuse,    ??  uncontrolled.     ??  Discussed smoking cessation counseling.  The patient has intolerances to Chantix in the past, started on Wellbutrin.  Higher dose by Dr Donney Rankins    ??  Discussed the importance of smoking cessation.     3.  Coronary disease,    ??  controlled.  No symptoms at this time.  Continue high intensity statin therapy.     4.  History of stroke?    ??   The patient was seen by neurology in the  past and is on chronic Plavix therapy.     5.  Hypertension, controlled.     6.  History of pulmonary embolus,    ??  provoked.  No indications for anticoagulation at this time.             Past Mal History, Past Surgical History, Family history, Social History, and Medications were all reviewed with the patient today and updated as necessary.         Outpatient Medications Marked as Taking for the 10/26/18 encounter (Office Visit) with Abner Greenspan,  MD          Medication  Sig  Dispense  Refill           ?  atorvastatin (LIPITOR) 80 mg tablet  Take 1 Tab by mouth daily.  90 Tab  1     ?  clopidogreL (PLAVIX) 75 mg tab  Take 1 Tab by mouth daily.  90 Tab  1     ?  isosorbide mononitrate ER (IMDUR) 30 mg tablet  Take 1 Tab by mouth daily.  90 Tab  1     ?  lisinopriL (PRINIVIL, ZESTRIL) 5 mg tablet  Take 1 Tab by mouth daily.  90 Tab  1     ?  carvediloL (COREG) 3.125 mg tablet  TAKE ONE TABLET BY MOUTH TWICE A DAY WITH MEALS  180 Tab  0     ?  pantoprazole (PROTONIX) 40 mg tablet  Take 40 mg by mouth daily.               ?  nitroglycerin (NITROSTAT) 0.4 mg SL tablet  1 Tab by SubLINGual route every five (5) minutes as needed for Chest Pain. Up to 3 doses.  1 Bottle  0        No Known Allergies     Past Medical History:        Diagnosis  Date         ?  GERD (gastroesophageal reflux disease)       ?  Hypercholesterolemia       ?  Hypertension       ?  Myocardial infarct, old  2014     ?  PE (pulmonary thromboembolism) ([place])  2012     ?  Stroke [facility])  2016          "mini stroke"          Past Surgical History:         Procedure  Laterality  Date          ?  HX CORONARY STENT PLACEMENT    08/15/2012     ?  HX LUMBAR DISKECTOMY    2015     ?  HX ROTATOR CUFF REPAIR  Left  2003          ?  HX ROTATOR CUFF REPAIR  Right  2016        No family history on file.     Social History          Tobacco Use         ?  Smoking status:  Current Every Day Smoker              Packs/day:  1.00         Years:  38.00         Pack  years:  38.00         ?  Smokeless tobacco:  Never Used       Substance Use Topics         ?  Alcohol use:  Yes             Comment: less than once a week, about once a month           ROS:      Review of Systems    Constitution: Negative for decreased appetite and fever.    HENT: Negative for congestion and nosebleeds.     Eyes: Negative for blurred vision.  Respiratory: Negative for cough and hemoptysis.     Endocrine: Negative for polydipsia.    Hematologic/Lymphatic: Negative for adenopathy and bleeding problem.    Skin: Negative for flushing.    Musculoskeletal: Negative for falls.    Gastrointestinal: Negative for abdominal pain.    Genitourinary: Negative for frequency.    Neurological: Negative for seizures.    Psychiatric/Behavioral: Negative for suicidal ideas.    Allergic/Immunologic: Negative for hives.              PHYSICAL EXAM:       Visit Vitals      BP  130/86 (BP 1 Location: Left arm, BP Patient Position: Sitting)     Pulse  73     Ht  '5\' 7"'  (1.702 m)     Wt  211 lb (95.7 kg)        BMI  33.05 kg/m??            Physical Exam    Constitutional: He is oriented to person, place, and time. He appears well-developed.    HENT:    Head: Normocephalic and atraumatic.    Eyes: Pupils are equal, round, and reactive to light.    Neck: Normal range of motion.    Cardiovascular: Normal rate.    No murmur heard.   Pulmonary/Chest: Effort normal.    Abdominal: Soft. He exhibits no distension. There is no abdominal tenderness.    Musculoskeletal:          General: No edema.   Neurological: He is alert and oriented to person, place, and time. No cranial nerve deficit.    Skin: Skin is warm and dry. No rash noted. No erythema.   Psychiatric: He has a normal mood and affect. His behavior is normal.           Medical problems and test results were reviewed with the patient today.       No results found for any visits on 10/26/18.           Recent Results (from the past 672 hour(s))     LIPID PANEL          Collection  Time: 10/05/18 10:00 AM         Result  Value  Ref Range            Cholesterol, total  134  100 - 199 mg/dL       Triglyceride  122  0 - 149 mg/dL       HDL Cholesterol  39 (L)  >39 mg/dL       VLDL Cholesterol Cal  22  5 - 40 mg/dL       LDL Chol Calc (NIH)  73  0 - 99 mg/dL       CBC WITH AUTOMATED DIFF          Collection Time: 10/05/18 10:00 AM         Result  Value  Ref Range            WBC  9.2  3.4 - 10.8 x10E3/uL       RBC  5.78  4.14 - 5.80 x10E6/uL       HGB  17.3  13.0 - 17.7 g/dL       HCT  51.7 (H)  37.5 - 51.0 %       MCV  89  79 - 97 fL       MCH  29.9  26.6 - 33.0 pg  MCHC  33.5  31.5 - 35.7 g/dL       RDW  11.8  11.6 - 15.4 %       PLATELET  236  150 - 450 x10E3/uL       NEUTROPHILS  71  Not Estab. %       Lymphocytes  19  Not Estab. %       MONOCYTES  8  Not Estab. %       EOSINOPHILS  1  Not Estab. %       BASOPHILS  1  Not Estab. %       ABS. NEUTROPHILS  6.5  1.4 - 7.0 x10E3/uL       Abs Lymphocytes  1.8  0.7 - 3.1 x10E3/uL       ABS. MONOCYTES  0.7  0.1 - 0.9 x10E3/uL       ABS. EOSINOPHILS  0.1  0.0 - 0.4 x10E3/uL       ABS. BASOPHILS  0.1  0.0 - 0.2 x10E3/uL       IMMATURE GRANULOCYTES  0  Not Estab. %       ABS. IMM. GRANS.  0.0  0.0 - 0.1 V40G8/QP       METABOLIC PANEL, COMPREHENSIVE          Collection Time: 10/05/18 10:00 AM         Result  Value  Ref Range            Glucose  88  65 - 99 mg/dL       BUN  16  6 - 24 mg/dL       Creatinine  0.99  0.76 - 1.27 mg/dL       GFR est non-AA  86  >59 mL/min/1.73       GFR est AA  99  >59 mL/min/1.73       BUN/Creatinine ratio  16  9 - 20       Sodium  138  134 - 144 mmol/L       Potassium  4.5  3.5 - 5.2 mmol/L       Chloride  102  96 - 106 mmol/L       CO2  24  20 - 29 mmol/L       Calcium  9.6  8.7 - 10.2 mg/dL       Protein, total  6.7  6.0 - 8.5 g/dL       Albumin  4.2  3.8 - 4.9 g/dL       GLOBULIN, TOTAL  2.5  1.5 - 4.5 g/dL       A-G Ratio  1.7  1.2 - 2.2       Bilirubin, total  0.6  0.0 - 1.2 mg/dL       Alk. phosphatase  94  39 -  117 IU/L       AST (SGOT)  20  0 - 40 IU/L       ALT (SGPT)  34  0 - 44 IU/L       HEPATITIS C AB          Collection Time: 10/05/18 10:00 AM         Result  Value  Ref Range            Hep C Virus Ab  <0.1  0.0 - 0.9 s/co ratio          Lab Results  Component  Value  Date/Time            Cholesterol, total  134  10/05/2018 10:00 AM       HDL Cholesterol  39 (L)  10/05/2018 10:00 AM       LDL, calculated  58  06/29/2018 09:35 AM       LDL Chol Calc (NIH)  73  10/05/2018 10:00 AM       VLDL, calculated  16  06/29/2018 09:35 AM       VLDL Cholesterol Cal  22  10/05/2018 10:00 AM            Triglyceride  122  10/05/2018 10:00 AM                  PLAN      Diagnoses and all orders for this visit:      1. Coronary artery disease involving native heart, angina presence unspecified, unspecified vessel or lesion type   -     AMB POC EKG ROUTINE W/ 12 LEADS, INTER & REP      2. Hyperlipidemia LDL goal <70      3. Hypertension, essential      4. Hx of pulmonary embolus      5. Tobacco abuse      6. Gastroesophageal reflux disease with esophagitis, unspecified whether hemorrhage   -     REFERRAL TO GASTROENTEROLOGY           Follow-up and Dispositions      ??  Return in about 6 months (around 04/26/2019).                           Thank you for allowing me to participate in this patient's care.  Please call or contact me if there are any questions or concerns regarding the above.        Abner Greenspan, MD   10/26/18   4:07 PM         Proofread, but unrecognized errors may exist.

## 2018-10-26 NOTE — Progress Notes (Addendum)
Michael Haney, [address]  [place] [address]  PHONE: [phone]    SUBJECTIVE:   Michael Haney is a 53 y.o. male [birth year]       Chief Complaint   Patient presents with   ??? Coronary Artery Disease     yearly            Coronary Artery Disease (yearly)    History of Present Illness:  The patient presented for consultation 11/08/2017. The patient presented for follow up 10/26/18.   Reflux sx no CP.  Still smoking.    Interval Hx:   Previous history of coronary artery disease, status post MI in 2014, recurrent cardiac catheterization.  The patient moved from [place] in 2019.  Previously a Production designer, theatre/television/film.  The patient has a previous history of tobacco abuse.  No symptoms of chest discomfort at this time, still smoking.      Cardiac History:    1. MI in 2014 in [place].  PCI to LAD, 3.5 x 18 Xience drug eluting stent.  2. ECG 11/02/2017 - sinus rhythm with anterior septal infarct, age indeterminate.   3. EKG 10/26/2018 Sinus  Rhythm Old anteroseptal infarct. ABNORMAL     Assessment and Plan:    1. Reflux   ?? Not controlled   ?? Ref to GI   2. Tobacco abuse,   ?? uncontrolled.    ?? Discussed smoking cessation counseling.  The patient has intolerances to Chantix in the past, started on Wellbutrin.  Higher dose by Dr Donney Rankins   ?? Discussed the importance of smoking cessation.    3. Coronary disease,   ?? controlled.  No symptoms at this time.  Continue high intensity statin therapy.    4. History of stroke?   ??  The patient was seen by neurology in the past and is on chronic Plavix therapy.    5. Hypertension, controlled.    6. History of pulmonary embolus,   ?? provoked.  No indications for anticoagulation at this time.         Past Mal History, Past Surgical History, Family history, Social History, and Medications were all reviewed with the patient today and updated as necessary.     Outpatient Medications Marked as Taking for the 10/26/18 encounter (Office Visit) with Abner Greenspan, MD    Medication Sig Dispense Refill   ??? atorvastatin (LIPITOR) 80 mg tablet Take 1 Tab by mouth daily. 90 Tab 1   ??? clopidogreL (PLAVIX) 75 mg tab Take 1 Tab by mouth daily. 90 Tab 1   ??? isosorbide mononitrate ER (IMDUR) 30 mg tablet Take 1 Tab by mouth daily. 90 Tab 1   ??? lisinopriL (PRINIVIL, ZESTRIL) 5 mg tablet Take 1 Tab by mouth daily. 90 Tab 1   ??? carvediloL (COREG) 3.125 mg tablet TAKE ONE TABLET BY MOUTH TWICE A DAY WITH MEALS 180 Tab 0   ??? pantoprazole (PROTONIX) 40 mg tablet Take 40 mg by mouth daily.     ??? nitroglycerin (NITROSTAT) 0.4 mg SL tablet 1 Tab by SubLINGual route every five (5) minutes as needed for Chest Pain. Up to 3 doses. 1 Bottle 0     No Known Allergies  Past Medical History:   Diagnosis Date   ??? GERD (gastroesophageal reflux disease)    ??? Hypercholesterolemia    ??? Hypertension    ??? Myocardial infarct, old 2014   ??? PE (pulmonary thromboembolism) ([place]) 2012   ??? Stroke ([place]) 2016    "mini stroke"  Past Surgical History:   Procedure Laterality Date   ??? HX CORONARY STENT PLACEMENT  08/15/2012   ??? HX LUMBAR DISKECTOMY  2015   ??? HX ROTATOR CUFF REPAIR Left 2003   ??? HX ROTATOR CUFF REPAIR Right 2016     No family history on file.  Social History     Tobacco Use   ??? Smoking status: Current Every Day Smoker     Packs/day: 1.00     Years: 38.00     Pack years: 38.00   ??? Smokeless tobacco: Never Used   Substance Use Topics   ??? Alcohol use: Yes     Comment: less than once a week, about once a month       ROS:    Review of Systems   Constitution: Negative for decreased appetite and fever.   HENT: Negative for congestion and nosebleeds.    Eyes: Negative for blurred vision.   Respiratory: Negative for cough and hemoptysis.    Endocrine: Negative for polydipsia.   Hematologic/Lymphatic: Negative for adenopathy and bleeding problem.   Skin: Negative for flushing.   Musculoskeletal: Negative for falls.   Gastrointestinal: Negative for abdominal pain.   Genitourinary: Negative for frequency.    Neurological: Negative for seizures.   Psychiatric/Behavioral: Negative for suicidal ideas.   Allergic/Immunologic: Negative for hives.          PHYSICAL EXAM:     Visit Vitals  BP 130/86 (BP 1 Location: Left arm, BP Patient Position: Sitting)   Pulse 73   Ht 5' 7" (1.702 m)   Wt 211 lb (95.7 kg)   BMI 33.05 kg/m??        Physical Exam   Constitutional: He is oriented to person, place, and time. He appears well-developed.   HENT:   Head: Normocephalic and atraumatic.   Eyes: Pupils are equal, round, and reactive to light.   Neck: Normal range of motion.   Cardiovascular: Normal rate.   No murmur heard.  Pulmonary/Chest: Effort normal.   Abdominal: Soft. He exhibits no distension. There is no abdominal tenderness.   Musculoskeletal:         General: No edema.   Neurological: He is alert and oriented to person, place, and time. No cranial nerve deficit.   Skin: Skin is warm and dry. No rash noted. No erythema.   Psychiatric: He has a normal mood and affect. His behavior is normal.       Medical problems and test results were reviewed with the patient today.     No results found for any visits on 10/26/18.      Recent Results (from the past 672 hour(s))   LIPID PANEL    Collection Time: 10/05/18 10:00 AM   Result Value Ref Range    Cholesterol, total 134 100 - 199 mg/dL    Triglyceride 122 0 - 149 mg/dL    HDL Cholesterol 39 (L) >39 mg/dL    VLDL Cholesterol Cal 22 5 - 40 mg/dL    LDL Chol Calc (NIH) 73 0 - 99 mg/dL   CBC WITH AUTOMATED DIFF    Collection Time: 10/05/18 10:00 AM   Result Value Ref Range    WBC 9.2 3.4 - 10.8 x10E3/uL    RBC 5.78 4.14 - 5.80 x10E6/uL    HGB 17.3 13.0 - 17.7 g/dL    HCT 51.7 (H) 37.5 - 51.0 %    MCV 89 79 - 97 fL    MCH 29.9 26.6 - 33.0 pg  MCHC 33.5 31.5 - 35.7 g/dL    RDW 11.8 11.6 - 15.4 %    PLATELET 236 150 - 450 x10E3/uL    NEUTROPHILS 71 Not Estab. %    Lymphocytes 19 Not Estab. %    MONOCYTES 8 Not Estab. %    EOSINOPHILS 1 Not Estab. %    BASOPHILS 1 Not Estab. %     ABS. NEUTROPHILS 6.5 1.4 - 7.0 x10E3/uL    Abs Lymphocytes 1.8 0.7 - 3.1 x10E3/uL    ABS. MONOCYTES 0.7 0.1 - 0.9 x10E3/uL    ABS. EOSINOPHILS 0.1 0.0 - 0.4 x10E3/uL    ABS. BASOPHILS 0.1 0.0 - 0.2 x10E3/uL    IMMATURE GRANULOCYTES 0 Not Estab. %    ABS. IMM. GRANS. 0.0 0.0 - 0.1 [id number]/FG   METABOLIC PANEL, COMPREHENSIVE    Collection Time: 10/05/18 10:00 AM   Result Value Ref Range    Glucose 88 65 - 99 mg/dL    BUN 16 6 - 24 mg/dL    Creatinine 0.99 0.76 - 1.27 mg/dL    GFR est non-AA 86 >59 mL/min/1.73    GFR est AA 99 >59 mL/min/1.73    BUN/Creatinine ratio 16 9 - 20    Sodium 138 134 - 144 mmol/L    Potassium 4.5 3.5 - 5.2 mmol/L    Chloride 102 96 - 106 mmol/L    CO2 24 20 - 29 mmol/L    Calcium 9.6 8.7 - 10.2 mg/dL    Protein, total 6.7 6.0 - 8.5 g/dL    Albumin 4.2 3.8 - 4.9 g/dL    GLOBULIN, TOTAL 2.5 1.5 - 4.5 g/dL    A-G Ratio 1.7 1.2 - 2.2    Bilirubin, total 0.6 0.0 - 1.2 mg/dL    Alk. phosphatase 94 39 - 117 IU/L    AST (SGOT) 20 0 - 40 IU/L    ALT (SGPT) 34 0 - 44 IU/L   HEPATITIS C AB    Collection Time: 10/05/18 10:00 AM   Result Value Ref Range    Hep C Virus Ab <0.1 0.0 - 0.9 s/co ratio     Lab Results   Component Value Date/Time    Cholesterol, total 134 10/05/2018 10:00 AM    HDL Cholesterol 39 (L) 10/05/2018 10:00 AM    LDL, calculated 58 06/29/2018 09:35 AM    LDL Chol Calc (NIH) 73 10/05/2018 10:00 AM    VLDL, calculated 16 06/29/2018 09:35 AM    VLDL Cholesterol Cal 22 10/05/2018 10:00 AM    Triglyceride 122 10/05/2018 10:00 AM            PLAN    Diagnoses and all orders for this visit:    1. Coronary artery disease involving native heart, angina presence unspecified, unspecified vessel or lesion type  -     AMB POC EKG ROUTINE W/ 12 LEADS, INTER & REP    2. Hyperlipidemia LDL goal <70    3. Hypertension, essential    4. Hx of pulmonary embolus    5. Tobacco abuse    6. Gastroesophageal reflux disease with esophagitis, unspecified whether hemorrhage  -     REFERRAL TO GASTROENTEROLOGY       Follow-up and Dispositions    ?? Return in about 6 months (around 04/26/2019).                  Thank you for allowing me to participate in this patient's care.  Please call or contact me if  there are any questions or concerns regarding the above.      Abner Greenspan, MD  10/26/18  4:07 PM      Proofread, but unrecognized errors may exist.

## 2018-11-23 ENCOUNTER — Telehealth: Payer: PRIVATE HEALTH INSURANCE | Attending: Family Medicine | Primary: Family Medicine

## 2018-11-23 NOTE — Progress Notes (Signed)
NO SHOW

## 2019-01-08 MED ORDER — PANTOPRAZOLE 40 MG TAB, DELAYED RELEASE
40 mg | ORAL_TABLET | ORAL | 0 refills | Status: DC
Start: 2019-01-08 — End: 2019-04-02

## 2019-04-01 ENCOUNTER — Encounter

## 2019-04-02 MED ORDER — CARVEDILOL 3.125 MG TAB
3.125 mg | ORAL_TABLET | ORAL | 0 refills | Status: AC
Start: 2019-04-02 — End: ?

## 2019-04-02 MED ORDER — PANTOPRAZOLE 40 MG TAB, DELAYED RELEASE
40 mg | ORAL_TABLET | ORAL | 0 refills | Status: AC
Start: 2019-04-02 — End: ?

## 2019-05-03 ENCOUNTER — Encounter: Attending: Internal Medicine | Primary: Family Medicine

## 2019-08-09 ENCOUNTER — Encounter

## 2019-08-09 NOTE — Telephone Encounter (Signed)
Not seen since oct----ENOUGH MEDS CAN BE GIVEN UNTIL GETTING IN

## 2020-05-28 NOTE — Telephone Encounter (Signed)
Michael Haney called and stated she had spoken with you earlier regarding this patient and would like for you to please give her a call [email or web address]    Thank you
